# Patient Record
Sex: Male | Born: 1965
Health system: Southern US, Community
[De-identification: ages and names within clinical notes are randomized; demographics above are authoritative.]

## PROBLEM LIST (undated history)

## (undated) ENCOUNTER — Ambulatory Visit (HOSPITAL_COMMUNITY): Admission: EM | Payer: 59 | Source: Home / Self Care

## (undated) DIAGNOSIS — R569 Unspecified convulsions: Secondary | ICD-10-CM

## (undated) DIAGNOSIS — G43909 Migraine, unspecified, not intractable, without status migrainosus: Secondary | ICD-10-CM

## (undated) DIAGNOSIS — Z91030 Bee allergy status: Secondary | ICD-10-CM

## (undated) DIAGNOSIS — I10 Essential (primary) hypertension: Secondary | ICD-10-CM

## (undated) HISTORY — DX: Bee allergy status: Z91.030

## (undated) HISTORY — DX: Unspecified convulsions: R56.9

## (undated) HISTORY — PX: NO PAST SURGERIES: SHX2092

## (undated) HISTORY — DX: Migraine, unspecified, not intractable, without status migrainosus: G43.909

---

## 2007-12-14 ENCOUNTER — Emergency Department (HOSPITAL_COMMUNITY): Admission: EM | Admit: 2007-12-14 | Discharge: 2007-12-14 | Payer: Self-pay | Admitting: Family Medicine

## 2011-07-26 LAB — INFLUENZA A AND B ANTIGEN (CONVERTED LAB)
Inflenza A Ag: NEGATIVE
Influenza B Ag: NEGATIVE

## 2011-07-26 LAB — POCT RAPID STREP A: Streptococcus, Group A Screen (Direct): POSITIVE — AB

## 2014-01-29 ENCOUNTER — Emergency Department (HOSPITAL_COMMUNITY)
Admission: EM | Admit: 2014-01-29 | Discharge: 2014-01-29 | Disposition: A | Discharge status: Home / Self Care | Payer: BC Managed Care – PPO | Attending: Emergency Medicine | Admitting: Emergency Medicine

## 2014-01-29 ENCOUNTER — Encounter (HOSPITAL_COMMUNITY): Payer: Self-pay | Admitting: Emergency Medicine

## 2014-01-29 ENCOUNTER — Emergency Department (HOSPITAL_COMMUNITY): Payer: BC Managed Care – PPO

## 2014-01-29 DIAGNOSIS — N200 Calculus of kidney: Secondary | ICD-10-CM | POA: Insufficient documentation

## 2014-01-29 DIAGNOSIS — I10 Essential (primary) hypertension: Secondary | ICD-10-CM | POA: Insufficient documentation

## 2014-01-29 HISTORY — DX: Essential (primary) hypertension: I10

## 2014-01-29 LAB — URINALYSIS, ROUTINE W REFLEX MICROSCOPIC
Bilirubin Urine: NEGATIVE
Glucose, UA: NEGATIVE mg/dL
Ketones, ur: NEGATIVE mg/dL
Leukocytes, UA: NEGATIVE
Nitrite: NEGATIVE
Protein, ur: NEGATIVE mg/dL
Specific Gravity, Urine: 1.017 (ref 1.005–1.030)
Urobilinogen, UA: 0.2 mg/dL (ref 0.0–1.0)
pH: 7 (ref 5.0–8.0)

## 2014-01-29 LAB — CBC WITH DIFFERENTIAL/PLATELET
Basophils Absolute: 0.1 10*3/uL (ref 0.0–0.1)
Basophils Relative: 1 % (ref 0–1)
Eosinophils Absolute: 0.2 10*3/uL (ref 0.0–0.7)
Eosinophils Relative: 2 % (ref 0–5)
HCT: 40.8 % (ref 39.0–52.0)
Hemoglobin: 14.4 g/dL (ref 13.0–17.0)
Lymphocytes Relative: 24 % (ref 12–46)
Lymphs Abs: 1.9 10*3/uL (ref 0.7–4.0)
MCH: 31.6 pg (ref 26.0–34.0)
MCHC: 35.3 g/dL (ref 30.0–36.0)
MCV: 89.5 fL (ref 78.0–100.0)
Monocytes Absolute: 0.8 10*3/uL (ref 0.1–1.0)
Monocytes Relative: 10 % (ref 3–12)
Neutro Abs: 5 10*3/uL (ref 1.7–7.7)
Neutrophils Relative %: 63 % (ref 43–77)
Platelets: 182 10*3/uL (ref 150–400)
RBC: 4.56 MIL/uL (ref 4.22–5.81)
RDW: 12.1 % (ref 11.5–15.5)
WBC: 8 10*3/uL (ref 4.0–10.5)

## 2014-01-29 LAB — COMPREHENSIVE METABOLIC PANEL
ALT: 42 U/L (ref 0–53)
AST: 38 U/L — ABNORMAL HIGH (ref 0–37)
Albumin: 3.7 g/dL (ref 3.5–5.2)
Alkaline Phosphatase: 72 U/L (ref 39–117)
BUN: 11 mg/dL (ref 6–23)
CO2: 27 mEq/L (ref 19–32)
Calcium: 9.1 mg/dL (ref 8.4–10.5)
Chloride: 103 mEq/L (ref 96–112)
Creatinine, Ser: 1.02 mg/dL (ref 0.50–1.35)
GFR calc Af Amer: >90 mL/min (ref >90)
GFR calc non Af Amer: 86 mL/min — ABNORMAL LOW (ref >90)
Glucose, Bld: 132 mg/dL — ABNORMAL HIGH (ref 70–99)
Potassium: 4 mEq/L (ref 3.7–5.3)
Sodium: 142 mEq/L (ref 137–147)
Total Bilirubin: <0.2 mg/dL — ABNORMAL LOW (ref 0.3–1.2)
Total Protein: 6.9 g/dL (ref 6.0–8.3)

## 2014-01-29 LAB — URINE MICROSCOPIC-ADD ON

## 2014-01-29 MED ORDER — HYDROMORPHONE HCL PF 1 MG/ML IJ SOLN
0.5000 mg | Freq: Once | INTRAMUSCULAR | Status: AC
  Administered 2014-01-29: 0.5 mg via INTRAVENOUS
  Filled 2014-01-29: qty 1

## 2014-01-29 MED ORDER — KETOROLAC TROMETHAMINE 30 MG/ML IJ SOLN
30.0000 mg | Freq: Once | INTRAMUSCULAR | Status: AC
  Administered 2014-01-29: 30 mg via INTRAVENOUS
  Filled 2014-01-29: qty 1

## 2014-01-29 MED ORDER — SODIUM CHLORIDE 0.9 % IV BOLUS (SEPSIS)
1000.0000 mL | Freq: Once | INTRAVENOUS | Status: AC
  Administered 2014-01-29: 1000 mL via INTRAVENOUS

## 2014-01-29 MED ORDER — SODIUM CHLORIDE 0.9 % IV BOLUS (SEPSIS): 1000.0000 mL | Freq: Once | INTRAVENOUS | Status: DC

## 2014-01-29 MED ORDER — HYDROMORPHONE HCL PF 1 MG/ML IJ SOLN
1.0000 mg | Freq: Once | INTRAMUSCULAR | Status: AC
  Administered 2014-01-29: 1 mg via INTRAVENOUS

## 2014-01-29 MED ORDER — HYDROMORPHONE HCL PF 1 MG/ML IJ SOLN
1.0000 mg | Freq: Once | INTRAMUSCULAR | Status: AC
Start: 2014-01-29 — End: 2014-01-29
  Administered 2014-01-29: 1 mg via INTRAVENOUS
  Filled 2014-01-29: qty 1

## 2014-01-29 MED ORDER — ONDANSETRON 4 MG PO TBDP: 4.0000 mg | ORAL_TABLET | Freq: Three times a day (TID) | ORAL | Status: DC | PRN

## 2014-01-29 MED ORDER — TAMSULOSIN HCL 0.4 MG PO CAPS: 0.4000 mg | ORAL_CAPSULE | Freq: Every day | ORAL | Status: DC

## 2014-01-29 MED ORDER — HYDROCODONE-ACETAMINOPHEN 5-325 MG PO TABS: 1.0000 | ORAL_TABLET | Freq: Four times a day (QID) | ORAL | Status: DC | PRN

## 2014-01-29 MED ORDER — HYDROMORPHONE HCL PF 1 MG/ML IJ SOLN
0.5000 mg | Freq: Once | INTRAMUSCULAR | Status: DC
  Filled 2014-01-29: qty 1

## 2014-01-29 NOTE — ED Notes (Signed)
Dr.Lockwood at bedside  

## 2014-01-29 NOTE — Discharge Instructions (Signed)
As discussed, your pain likely reflects the presence/passage of a kidney stone.  Physical medication as directed and be sure to follow up with our urologists.  Return here for any concerning changes in your condition.   Kidney Stones Kidney stones (urolithiasis) are deposits that form inside your kidneys. The intense pain is caused by the stone moving through the urinary tract. When the stone moves, the ureter goes into spasm around the stone. The stone is usually passed in the urine.  CAUSES   A disorder that makes certain neck glands produce too much parathyroid hormone (primary hyperparathyroidism).  A buildup of uric acid crystals, similar to gout in your joints.  Narrowing (stricture) of the ureter.  A kidney obstruction present at birth (congenital obstruction).  Previous surgery on the kidney or ureters.  Numerous kidney infections. SYMPTOMS   Feeling sick to your stomach (nauseous).  Throwing up (vomiting).  Blood in the urine (hematuria).  Pain that usually spreads (radiates) to the groin.  Frequency or urgency of urination. DIAGNOSIS   Taking a history and physical exam.  Blood or urine tests.  CT scan.  Occasionally, an examination of the inside of the urinary bladder (cystoscopy) is performed. TREATMENT   Observation.  Increasing your fluid intake.  Extracorporeal shock wave lithotripsy This is a noninvasive procedure that uses shock waves to break up kidney stones.  Surgery may be needed if you have severe pain or persistent obstruction. There are various surgical procedures. Most of the procedures are performed with the use of small instruments. Only small incisions are needed to accommodate these instruments, so recovery time is minimized. The size, location, and chemical composition are all important variables that will determine the proper choice of action for you. Talk to your health care provider to better understand your situation so that you  will minimize the risk of injury to yourself and your kidney.  HOME CARE INSTRUCTIONS   Drink enough water and fluids to keep your urine clear or pale yellow. This will help you to pass the stone or stone fragments.  Strain all urine through the provided strainer. Keep all particulate matter and stones for your health care provider to see. The stone causing the pain may be as small as a grain of salt. It is very important to use the strainer each and every time you pass your urine. The collection of your stone will allow your health care provider to analyze it and verify that a stone has actually passed. The stone analysis will often identify what you can do to reduce the incidence of recurrences.  Only take over-the-counter or prescription medicines for pain, discomfort, or fever as directed by your health care provider.  Make a follow-up appointment with your health care provider as directed.  Get follow-up X-rays if required. The absence of pain does not always mean that the stone has passed. It may have only stopped moving. If the urine remains completely obstructed, it can cause loss of kidney function or even complete destruction of the kidney. It is your responsibility to make sure X-rays and follow-ups are completed. Ultrasounds of the kidney can show blockages and the status of the kidney. Ultrasounds are not associated with any radiation and can be performed easily in a matter of minutes. SEEK MEDICAL CARE IF:  You experience pain that is progressive and unresponsive to any pain medicine you have been prescribed. SEEK IMMEDIATE MEDICAL CARE IF:   Pain cannot be controlled with the prescribed medicine.  You have  a fever or shaking chills.  The severity or intensity of pain increases over 18 hours and is not relieved by pain medicine.  You develop a new onset of abdominal pain.  You feel faint or pass out.  You are unable to urinate. MAKE SURE YOU:   Understand these  instructions.  Will watch your condition.  Will get help right away if you are not doing well or get worse. Document Released: 10/21/2005 Document Revised: 06/23/2013 Document Reviewed: 03/24/2013 [facility] Patient Information 2014 [place], [place].

## 2014-01-29 NOTE — ED Notes (Signed)
Pt c/o severe progressive right flank pain that has just suddenly worsened within the last half hour, patient states he feels like his stomach is full of air, 2 or 3 days ago patient noted that his urine was a cloudy "close to orange color"

## 2014-01-29 NOTE — ED Provider Notes (Signed)
CSN: [id number]     Arrival date & time 01/29/14  [id number] History   First MD Initiated Contact with Patient 01/29/14 0815     Chief Complaint  Patient presents with  . Flank Pain    right side     HPI  Patient presents with abdominal pain, nausea. Symptoms began approximately one hour ago.  Since onset has been severe is focally about the right flank, with radiation towards the right inguinal crease. There is associated nausea, generalized abdominal discomfort as well. No symptoms at relief thus far. Patient was generally well until this, though he notes that over the past few days he has had discolored urine. No dysuria, no penile or scrotal pain. No fever, no chills, no chest pain, no dyspnea. Patient has no history of abdominal surgery or any stone. He does have a history of hypertension for which he is not currently taking his medication.   Past Medical History  Diagnosis Date  . HTN (hypertension)    History reviewed. No pertinent past surgical history. No family history on file. History  Substance Use Topics  . Smoking status: Not on file  . Smokeless tobacco: Not on file  . Alcohol Use: Not on file    Review of Systems  Constitutional:       Per HPI, otherwise negative  HENT:       Per HPI, otherwise negative  Respiratory:       Per HPI, otherwise negative  Cardiovascular:       Per HPI, otherwise negative  Gastrointestinal: Positive for nausea and abdominal pain. Negative for vomiting.  Endocrine:       Negative aside from HPI  Genitourinary:       Darker urine recently  Musculoskeletal:       Per HPI, otherwise negative  Skin: Negative.   Neurological: Negative for syncope.      Allergies  Bee venom  Home Medications  No current outpatient prescriptions on file. BP 182/109  Pulse 63  Temp(Src) 98.7 F (37.1 C) (Axillary)  Resp 22  Ht 5\' 7"  (1.702 m)  Wt 140 lb (63.504 kg)  BMI 21.92 kg/m2  SpO2 100% Physical Exam  Nursing note and vitals  reviewed. Constitutional: He is oriented to person, place, and time. He appears well-developed. No distress.  HENT:  Head: Normocephalic and atraumatic.  Eyes: Conjunctivae and EOM are normal.  Cardiovascular: Normal rate and regular rhythm.   Pulmonary/Chest: Effort normal. No stridor. No respiratory distress.  Abdominal: Soft. Normal appearance. He exhibits no distension. There is tenderness in the right lower quadrant. There is CVA tenderness.    Musculoskeletal: He exhibits no edema.  Neurological: He is alert and oriented to person, place, and time.  Skin: Skin is warm and dry.  Psychiatric: He has a normal mood and affect.    ED Course  Procedures (including critical care time) Labs Review Labs Reviewed  COMPREHENSIVE METABOLIC PANEL - Abnormal; Notable for the following:    Glucose, Bld 132 (*)    AST 38 (*)    Total Bilirubin <0.2 (*)    GFR calc non Af Amer 86 (*)    All other components within normal limits  URINALYSIS, ROUTINE W REFLEX MICROSCOPIC - Abnormal; Notable for the following:    Hgb urine dipstick SMALL (*)    All other components within normal limits  CBC WITH DIFFERENTIAL  URINE MICROSCOPIC-ADD ON   Imaging Review No results found.  11:08 AM Pain has resolved  1:30 PM  Patient and wife aware of all results.  Patient sitting up, and in no distress. Ultrasound is not clearly demonstrated a stone, and there is mild hydronephrosis, but no perinephric stranding or overt signs of infection. Patient has no other signs of infection. Patient continues to make urine.   MDM   Final diagnoses:  Kidney stone    Patient presents with acute onset of right flank pain.  On exam he is awake, alert, afebrile, hemodynamically stable, but mild hypertension.  The patient has not taken today's blood pressure medication. There is no evidence of endorgan failure. Patient's presentation is most consistent with kidney stone.  After a lengthy conversation about all  findings, the patient deferred CT scan for further elucidation of possible stone given the remainder of his clinical scenario is most consistent with stone.  This seems reasonable.  Patient will follow up with urology.    Gerhard Munch, MD 01/29/14 1332

## 2014-01-29 NOTE — ED Notes (Signed)
MD at bedside. 

## 2014-03-01 ENCOUNTER — Ambulatory Visit (INDEPENDENT_AMBULATORY_CARE_PROVIDER_SITE_OTHER): Payer: BC Managed Care – PPO | Admitting: General Surgery

## 2014-03-01 ENCOUNTER — Encounter (INDEPENDENT_AMBULATORY_CARE_PROVIDER_SITE_OTHER): Payer: Self-pay | Admitting: General Surgery

## 2014-03-01 ENCOUNTER — Ambulatory Visit (INDEPENDENT_AMBULATORY_CARE_PROVIDER_SITE_OTHER): Payer: Self-pay | Admitting: General Surgery

## 2014-03-01 VITALS — BP 140/80 | HR 74 | Temp 96.8°F | Resp 14 | Ht 66.0 in | Wt 143.8 lb

## 2014-03-01 DIAGNOSIS — K4090 Unilateral inguinal hernia, without obstruction or gangrene, not specified as recurrent: Secondary | ICD-10-CM

## 2014-03-01 NOTE — Patient Instructions (Signed)
You have a small right inguinal hernia that is reducible.  There is no emergency, but over time  this will enlarge and become painful  I do not think that the hernia caused your right flank pain and transient right hydronephrosis.. I recommended that you discuss this further with Dr. Margarita [name].  Discuss this with your wife, and call me back when you decide to have the surgery. Repair of your hernia would be an outpatient surgery, and you would go home the same day.     Inguinal Hernia, Adult Muscles help keep everything in the body in its proper place. But if a weak spot in the muscles develops, something can poke through. That is called a hernia. When this happens in the lower part of the belly (abdomen), it is called an inguinal hernia. (It takes its name from a part of the body in this region called the inguinal canal.) A weak spot in the wall of muscles lets some fat or part of the small intestine bulge through. An inguinal hernia can develop at any age. Men get them more often than women. CAUSES  In adults, an inguinal hernia develops over time.  It can be triggered by:  Suddenly straining the muscles of the lower abdomen.  Lifting heavy objects.  Straining to have a bowel movement. Difficult bowel movements (constipation) can lead to this.  Constant coughing. This may be caused by smoking or lung disease.  Being overweight.  Being pregnant.  Working at a job that requires long periods of standing or heavy lifting.  Having had an inguinal hernia before. One type can be an emergency situation. It is called a strangulated inguinal hernia. It develops if part of the small intestine slips through the weak spot and cannot get back into the abdomen. The blood supply can be cut off. If that happens, part of the intestine may die. This situation requires emergency surgery. SYMPTOMS  Often, a small inguinal hernia has no symptoms. It is found when a healthcare provider does a physical  exam. Larger hernias usually have symptoms.   In adults, symptoms may include:  A lump in the groin. This is easier to see when the person is standing. It might disappear when lying down.  In men, a lump in the scrotum.  Pain or burning in the groin. This occurs especially when lifting, straining or coughing.  A dull ache or feeling of pressure in the groin.  Signs of a strangulated hernia can include:  A bulge in the groin that becomes very painful and tender to the touch.  A bulge that turns red or purple.  Fever, nausea and vomiting.  Inability to have a bowel movement or to pass gas. DIAGNOSIS  To decide if you have an inguinal hernia, a healthcare provider will probably do a physical examination.  This will include asking questions about any symptoms you have noticed.  The healthcare provider might feel the groin area and ask you to cough. If an inguinal hernia is felt, the healthcare provider may try to slide it back into the abdomen.  Usually no other tests are needed. TREATMENT  Treatments can vary. The size of the hernia makes a difference. Options include:  Watchful waiting. This is often suggested if the hernia is small and you have had no symptoms.  No medical procedure will be done unless symptoms develop.  You will need to watch closely for symptoms. If any occur, contact your healthcare provider right away.  Surgery. This is  used if the hernia is larger or you have symptoms.  Open surgery. This is usually an outpatient procedure (you will not stay overnight in a hospital). An cut (incision) is made through the skin in the groin. The hernia is put back inside the abdomen. The weak area in the muscles is then repaired by herniorrhaphy or hernioplasty. Herniorrhaphy: in this type of surgery, the weak muscles are sewn back together. Hernioplasty: a patch or mesh is used to close the weak area in the abdominal wall.  Laparoscopy. In this procedure, a surgeon makes  small incisions. A thin tube with a tiny video camera (called a laparoscope) is put into the abdomen. The surgeon repairs the hernia with mesh by looking with the video camera and using two long instruments. HOME CARE INSTRUCTIONS   After surgery to repair an inguinal hernia:  You will need to take pain medicine prescribed by your healthcare provider. Follow all directions carefully.  You will need to take care of the wound from the incision.  Your activity will be restricted for awhile. This will probably include no heavy lifting for several weeks. You also should not do anything too active for a few weeks. When you can return to work will depend on the type of job that you have.  During "watchful waiting" periods, you should:  Maintain a healthy weight.  Eat a diet high in fiber (fruits, vegetables and whole grains).  Drink plenty of fluids to avoid constipation. This means drinking enough water and other liquids to keep your urine clear or pale yellow.  Do not lift heavy objects.  Do not stand for long periods of time.  Quit smoking. This should keep you from developing a frequent cough. SEEK MEDICAL CARE IF:   A bulge develops in your groin area.  You feel pain, a burning sensation or pressure in the groin. This might be worse if you are lifting or straining.  You develop a fever of more than 100.5 F (38.1 C). SEEK IMMEDIATE MEDICAL CARE IF:   Pain in the groin increases suddenly.  A bulge in the groin gets bigger suddenly and does not go down.  For men, there is sudden pain in the scrotum. Or, the size of the scrotum increases.  A bulge in the groin area becomes red or purple and is painful to touch.  You have nausea or vomiting that does not go away.  You feel your heart beating much faster than normal.  You cannot have a bowel movement or pass gas.  You develop a fever of more than 102.0 F (38.9 C). Document Released: 03/09/2009 Document Revised: 01/13/2012  Document Reviewed: 03/09/2009 [facility] Patient Information 2014 [place].

## 2014-03-01 NOTE — Progress Notes (Signed)
Patient ID: Jonathan SearingJohn P Corsi, male   DOB: 1966/05/26, 48 y.o.   MRN: [id number]  Chief Complaint  Patient presents with  . Incisional Hernia     Note: This dictation was prepared with Dragon/digital dictation along with Smartphrase technology. Any transcriptional errors that result from this process are unintentional.   HPI Jonathan [name] is a 48 y.o. male.  He is referred by Dr. Margarita GrizzleWoodruff of [facility] for evaluation of a right inguinal hernia.  The patient has no prior history of hernia. On March 28 he presented to the emergency department with severe right flank pain. He had right lower quadrant pain and right testicular pain but it was a much lesser intent intensity. Kidney stone was suspected. Ultrasound showed right hydronephrosis but no stone. CT was not done. His pain resolved within 2 hours. He [facility] April [facility]. CT stone protocol was performed which showed that the hydronephrosis had resolved, no stones were seen, and a small right inguinal hernia containing fat was noted. He feels fine today.  Past history reveals that he is healthy. Has hypertension. No other major medical problems. He is married and has an 7011 month-old baby at home.  HPI  Past Medical History  Diagnosis Date  . HTN (hypertension)     History reviewed. No pertinent past surgical history.  Family History  Problem Relation Age of Onset  . Cancer Maternal Grandmother     breast    Social History History  Substance Use Topics  . Smoking status: Never Smoker   . Smokeless tobacco: Not on file  . Alcohol Use: Yes    Allergies  Allergen Reactions  . Bee Venom     When he was child    Current Outpatient Prescriptions  Medication Sig Dispense Refill  . lisinopril-hydrochlorothiazide (PRINZIDE,ZESTORETIC) 20-12.5 MG per tablet Take 1 tablet by mouth daily.       No current facility-administered medications for this visit.    Review of  Systems Review of Systems  Constitutional: Negative for fever, chills and unexpected weight change.  HENT: Negative for congestion, hearing loss, sore throat, trouble swallowing and voice change.   Eyes: Negative for visual disturbance.  Respiratory: Negative for cough and wheezing.   Cardiovascular: Negative for chest pain, palpitations and leg swelling.  Gastrointestinal: Positive for abdominal pain. Negative for nausea, vomiting, diarrhea, constipation, blood in stool, abdominal distention, anal bleeding and rectal pain.  Genitourinary: Positive for flank pain and testicular pain. Negative for hematuria and difficulty urinating.  Musculoskeletal: Negative for arthralgias.  Skin: Negative for rash and wound.  Neurological: Negative for seizures, syncope, weakness and headaches.  Hematological: Negative for adenopathy. Does not bruise/bleed easily.  Psychiatric/Behavioral: Negative for confusion.    Blood pressure 140/80, pulse 74, temperature 96.8 F (36 C), resp. rate 14, height 5\' 6"  (1.676 m), weight 143 lb 12.8 oz (65.227 kg).  Physical Exam Physical Exam  Constitutional: He is oriented to person, place, and time. He appears well-developed and well-nourished. No distress.  thin. Appears fit.  HENT:  Head: Normocephalic.  Nose: Nose normal.  Mouth/Throat: No oropharyngeal exudate.  Eyes: Conjunctivae and EOM are normal. Pupils are equal, round, and reactive to light. Right eye exhibits no discharge. Left eye exhibits no discharge. No scleral icterus.  Neck: Normal range of motion. Neck supple. No JVD present. No tracheal deviation present. No thyromegaly present.  Cardiovascular: Normal rate, regular rhythm, normal heart sounds and intact distal pulses.  No murmur heard. Pulmonary/Chest: Effort normal and breath sounds normal. No stridor. No respiratory distress. He has no wheezes. He has no rales. He exhibits no tenderness.  Abdominal: Soft. Bowel sounds are normal. He exhibits  no distension and no mass. There is no tenderness. There is no rebound and no guarding.  Soft and nontender. Umbilicus normal. No flank tenderness.  Genitourinary:  Small but definite reducible right inguinal hernia. No hernia on left. Penis scrotum and testes normal.  Musculoskeletal: Normal range of motion. He exhibits no edema and no tenderness.  Lymphadenopathy:    He has no cervical adenopathy.  Neurological: He is alert and oriented to person, place, and time. He has normal reflexes. Coordination normal.  Skin: Skin is warm and dry. No rash noted. He is not diaphoretic. No erythema. No pallor.  Psychiatric: He has a normal mood and affect. His behavior is normal. Judgment and thought content normal.    Data Reviewed ED records. Ultrasound. Urology records. CT stone protocol.  Assessment    Right inguinal hernia, minimally symptomatic  History of severe right flank pain radiating to the right testicle. I do not think that this pain was due to his hernia. Given the transient hydronephrosis, urologic etiology is suspected.     Plan    I recommended that he followup with Dr. Margarita GrizzleWoodruff electively to see if anything further is indicated from a urologic standpoint  In terms of his right inguinal hernia. I discussed the natural history of this. He is aware that of the potential for enlargement and pain. He is aware that surgical repair  is appropriate but elective. He is thinking that he may want to do this sooner rather than later.  I discussed the indications, details, techniques, and numerous risks of the surgery with him. I discussed open techniques and laparoscopic techniques. He did not seem strongly in favor one over the other. He is aware of the risk of bleeding, infection, recurrence, nerve damage with chronic pain, injury to adjacent organs as the testicle, bladder intestine with major restruction. All his questions were answered. He understands all these issues.  He is going to  go home and discuss this with his wife. He will call me back when he makes a decision about repair of right inguinal hernia.        Angelia MouldHaywood M. Derrell LollingIngram, M.D., [facility] General and Minimally invasive Surgery Breast and Colorectal Surgery Office:   [phone] Pager:   [phone]  03/01/2014, 11:53 AM

## 2014-03-10 ENCOUNTER — Encounter (INDEPENDENT_AMBULATORY_CARE_PROVIDER_SITE_OTHER): Payer: Self-pay

## 2014-06-20 ENCOUNTER — Emergency Department (HOSPITAL_COMMUNITY)
Admission: EM | Admit: 2014-06-20 | Discharge: 2014-06-20 | Disposition: A | Discharge status: Home / Self Care | Payer: BC Managed Care – PPO | Attending: Emergency Medicine | Admitting: Emergency Medicine

## 2014-06-20 ENCOUNTER — Emergency Department (HOSPITAL_COMMUNITY): Payer: BC Managed Care – PPO

## 2014-06-20 ENCOUNTER — Encounter (HOSPITAL_COMMUNITY): Payer: Self-pay | Admitting: Emergency Medicine

## 2014-06-20 DIAGNOSIS — W268XXA Contact with other sharp object(s), not elsewhere classified, initial encounter: Secondary | ICD-10-CM | POA: Insufficient documentation

## 2014-06-20 DIAGNOSIS — Y9289 Other specified places as the place of occurrence of the external cause: Secondary | ICD-10-CM | POA: Insufficient documentation

## 2014-06-20 DIAGNOSIS — Z79899 Other long term (current) drug therapy: Secondary | ICD-10-CM | POA: Insufficient documentation

## 2014-06-20 DIAGNOSIS — W208XXA Other cause of strike by thrown, projected or falling object, initial encounter: Secondary | ICD-10-CM | POA: Insufficient documentation

## 2014-06-20 DIAGNOSIS — I10 Essential (primary) hypertension: Secondary | ICD-10-CM | POA: Insufficient documentation

## 2014-06-20 DIAGNOSIS — S61509A Unspecified open wound of unspecified wrist, initial encounter: Secondary | ICD-10-CM | POA: Diagnosis present

## 2014-06-20 DIAGNOSIS — IMO0002 Reserved for concepts with insufficient information to code with codable children: Secondary | ICD-10-CM

## 2014-06-20 DIAGNOSIS — Y9389 Activity, other specified: Secondary | ICD-10-CM | POA: Insufficient documentation

## 2014-06-20 DIAGNOSIS — T148XXA Other injury of unspecified body region, initial encounter: Secondary | ICD-10-CM

## 2014-06-20 MED ORDER — LIDOCAINE HCL 2 % IJ SOLN
10.0000 mL | Freq: Once | INTRAMUSCULAR | Status: AC
  Administered 2014-06-20: 200 mg via INTRADERMAL

## 2014-06-20 MED ORDER — ACETAMINOPHEN 500 MG PO TABS
1000.0000 mg | ORAL_TABLET | Freq: Once | ORAL | Status: AC
  Administered 2014-06-20: 1000 mg via ORAL
  Filled 2014-06-20: qty 2

## 2014-06-20 NOTE — ED Notes (Addendum)
Pt comes in with laceration to left wrist when moving a cabinet and not realizing glass was on top of it falling off and cutting pt's left wrist.  Pt has about 1 inch laceration. Put Normal Saline soaked gauze over it and wrapped with Kerlex.

## 2014-06-20 NOTE — ED Provider Notes (Signed)
CSN: [id number]     Arrival date & time 06/20/14  1610 History   First MD Initiated Contact with Patient 06/20/14 1837     Chief Complaint  Patient presents with  . Laceration    left wrist     (Consider location/radiation/quality/duration/timing/severity/associated sxs/prior Treatment) HPI  Jonathan [name] is a 48 y.o. male complaining of laceration to left wrist after a piece of glass fell onto it earlier in the evening and shattered. Patient denies any pulsatile bleeding. States his last shot was within last 3 years. Pain is minimal and bleeding is controlled. He denies any weakness, numbness, paresthesia, reduced range of motion. Patient is right-hand dominant   Past Medical History  Diagnosis Date  . HTN (hypertension)    History reviewed. No pertinent past surgical history. Family History  Problem Relation Age of Onset  . Cancer Maternal Grandmother     breast   History  Substance Use Topics  . Smoking status: Never Smoker   . Smokeless tobacco: Not on file  . Alcohol Use: Yes    Review of Systems  10 systems reviewed and found to be negative, except as noted in the HPI.   Allergies  Bee venom  Home Medications   Prior to Admission medications   Medication Sig Start Date End Date Taking? Authorizing Provider  lisinopril-hydrochlorothiazide (PRINZIDE,ZESTORETIC) 20-12.5 MG per tablet Take 1 tablet by mouth daily.   Yes Historical Provider, MD   BP 194/90  Pulse 74  Temp(Src) 97.9 F (36.6 C) (Oral)  Resp 17  SpO2 99% Physical Exam  Nursing note and vitals reviewed. Constitutional: He is oriented to person, place, and time. He appears well-developed and well-nourished. No distress.  HENT:  Head: Normocephalic and atraumatic.  Mouth/Throat: Oropharynx is clear and moist.  Eyes: Conjunctivae and EOM are normal. Pupils are equal, round, and reactive to light.  Neck: Normal range of motion.  Cardiovascular: Normal rate, regular rhythm and intact distal  pulses.   Pulmonary/Chest: Effort normal and breath sounds normal. No stridor. No respiratory distress. He has no wheezes. He has no rales. He exhibits no tenderness.  Abdominal: Soft. Bowel sounds are normal. He exhibits no distension and no mass. There is no tenderness. There is no rebound and no guarding.  Musculoskeletal: Normal range of motion.       Arms: Neurological: He is alert and oriented to person, place, and time.  Psychiatric: He has a normal mood and affect.    ED Course  LACERATION REPAIR Date/Time: 06/21/2014 2:17 AM Performed by: Wynetta EmeryPISCIOTTA, Eleanor Gatliff Authorized by: Wynetta EmeryPISCIOTTA, Gavino Fouch Consent: Verbal consent obtained. Consent given by: patient Patient identity confirmed: verbally with patient Body area: upper extremity Location details: left wrist Laceration length: 3 cm Foreign bodies: no foreign bodies Tendon involvement: none Nerve involvement: none Vascular damage: no Anesthesia: local infiltration Local anesthetic: lidocaine 2% without epinephrine Anesthetic total: 2 ml Patient sedated: no Preparation: Patient was prepped and draped in the usual sterile fashion. Irrigation solution: saline Irrigation method: jet lavage Debridement: none Degree of undermining: none Skin closure: 4-0 nylon Number of sutures: 4 Technique: running Approximation: close Approximation difficulty: simple Patient tolerance: Patient tolerated the procedure well with no immediate complications.   (including critical care time) Labs Review Labs Reviewed - No data to display  Imaging Review Dg Wrist Complete Left  06/20/2014   CLINICAL DATA:  Laceration to radial side of LEFT wrist  EXAM: LEFT WRIST - COMPLETE 3+ VIEW  COMPARISON:  None  FINDINGS: Dressing artifacts at  radial aspect of LEFT wrist.  Osseous mineralization normal.  Joint spaces preserved.  No acute fracture, dislocation, or bone destruction.  Soft tissues otherwise unremarkable without gross radiopaque foreign body  identified.  IMPRESSION: No acute osseous abnormalities.   Electronically Signed   By: Ulyses Southward M.D.   On: 06/20/2014 17:28     EKG Interpretation None      MDM   Final diagnoses:  Laceration  Skin avulsion   Filed Vitals:   06/20/14 1624  BP: 194/90  Pulse: 74  Temp: 97.9 F (36.6 C)  TempSrc: Oral  Resp: 17  SpO2: 99%    Medications  acetaminophen (TYLENOL) tablet 1,000 mg (1,000 mg Oral Given 06/20/14 1932)  lidocaine (XYLOCAINE) 2 % (with pres) injection 200 mg (200 mg Intradermal Given by Other 06/20/14 1933)    Jonathan [name] is a 48 y.o. male presenting with laceration and avulsion to left wrist.  No vascular, tendon or nerve involvement. The laceration is closed and wound care is discussed for the avulsion`   Evaluation does not show pathology that would require ongoing emergent intervention or inpatient treatment. Pt is hemodynamically stable and mentating appropriately. Discussed findings and plan with patient/guardian, who agrees with care plan. All questions answered. Return precautions discussed and outpatient follow up given.     Wynetta Emery, PA-C 06/21/14 [phone]

## 2014-06-20 NOTE — Discharge Instructions (Signed)
Keep wound dry and do not remove dressing for 24 hours if possible. After that, wash gently morning and night (every 12 hours) with soap and water. Use a topical antibiotic ointment and cover with a bandaid or gauze.    Do NOT use rubbing alcohol or hydrogen peroxide, do not soak the area   Present to your primary care doctor or the urgent care of your choice, or the ED for suture removal in 7-10 days.   Every attempt was made to remove foreign body (contaminants) from the wound.  However, there is always a chance that some may remain in the wound. This can  increase your risk of infection.   If you see signs of infection (warmth, redness, tenderness, pus, sharp increase in pain, fever, red streaking in the skin) immediately return to the emergency department.   After the wound heals fully, apply sunscreen for 6-12 months to minimize scarring.   Please follow with your primary care doctor in the next 2 days for a check-up. They must obtain records for further management.   Do not hesitate to return to the Emergency Department for any new, worsening or concerning symptoms.   Laceration Care, Adult A laceration is a cut or lesion that goes through all layers of the skin and into the tissue just beneath the skin. TREATMENT  Some lacerations may not require closure. Some lacerations may not be able to be closed due to an increased risk of infection. It is important to see your caregiver as soon as possible after an injury to minimize the risk of infection and maximize the opportunity for successful closure. If closure is appropriate, pain medicines may be given, if needed. The wound will be cleaned to help prevent infection. Your caregiver will use stitches (sutures), staples, wound glue (adhesive), or skin adhesive strips to repair the laceration. These tools bring the skin edges together to allow for faster healing and a better cosmetic outcome. However, all wounds will heal with a scar. Once the  wound has healed, scarring can be minimized by covering the wound with sunscreen during the day for 1 full year. HOME CARE INSTRUCTIONS  For sutures or staples:  Keep the wound clean and dry.  If you were given a bandage (dressing), you should change it at least once a day. Also, change the dressing if it becomes wet or dirty, or as directed by your caregiver.  Wash the wound with soap and water 2 times a day. Rinse the wound off with water to remove all soap. Pat the wound dry with a clean towel.  After cleaning, apply a thin layer of the antibiotic ointment as recommended by your caregiver. This will help prevent infection and keep the dressing from sticking.  You may shower as usual after the first 24 hours. Do not soak the wound in water until the sutures are removed.  Only take over-the-counter or prescription medicines for pain, discomfort, or fever as directed by your caregiver.  Get your sutures or staples removed as directed by your caregiver. For skin adhesive strips:  Keep the wound clean and dry.  Do not get the skin adhesive strips wet. You may bathe carefully, using caution to keep the wound dry.  If the wound gets wet, pat it dry with a clean towel.  Skin adhesive strips will fall off on their own. You may trim the strips as the wound heals. Do not remove skin adhesive strips that are still stuck to the wound. They will  fall off in time. For wound adhesive:  You may briefly wet your wound in the shower or bath. Do not soak or scrub the wound. Do not swim. Avoid periods of heavy perspiration until the skin adhesive has fallen off on its own. After showering or bathing, gently pat the wound dry with a clean towel.  Do not apply liquid medicine, cream medicine, or ointment medicine to your wound while the skin adhesive is in place. This may loosen the film before your wound is healed.  If a dressing is placed over the wound, be careful not to apply tape directly over the  skin adhesive. This may cause the adhesive to be pulled off before the wound is healed.  Avoid prolonged exposure to sunlight or tanning lamps while the skin adhesive is in place. Exposure to ultraviolet light in the first year will darken the scar.  The skin adhesive will usually remain in place for 5 to 10 days, then naturally fall off the skin. Do not pick at the adhesive film. You may need a tetanus shot if:  You cannot remember when you had your last tetanus shot.  You have never had a tetanus shot. If you get a tetanus shot, your arm may swell, get red, and feel warm to the touch. This is common and not a problem. If you need a tetanus shot and you choose not to have one, there is a rare chance of getting tetanus. Sickness from tetanus can be serious. SEEK MEDICAL CARE IF:   You have redness, swelling, or increasing pain in the wound.  You see a red line that goes away from the wound.  You have yellowish-white fluid (pus) coming from the wound.  You have a fever.  You notice a bad smell coming from the wound or dressing.  Your wound breaks open before or after sutures have been removed.  You notice something coming out of the wound such as wood or glass.  Your wound is on your hand or foot and you cannot move a finger or toe. SEEK IMMEDIATE MEDICAL CARE IF:   Your pain is not controlled with prescribed medicine.  You have severe swelling around the wound causing pain and numbness or a change in color in your arm, hand, leg, or foot.  Your wound splits open and starts bleeding.  You have worsening numbness, weakness, or loss of function of any joint around or beyond the wound.  You develop painful lumps near the wound or on the skin anywhere on your body. MAKE SURE YOU:   Understand these instructions.  Will watch your condition.  Will get help right away if you are not doing well or get worse. Document Released: 10/21/2005 Document Revised: 01/13/2012 Document  Reviewed: 04/16/2011 [facility] Patient Information 2015 [place]ExitCare, [place]. This information is not intended to replace advice given to you by your health care provider. Make sure you discuss any questions you have with your health care provider.

## 2014-06-22 NOTE — ED Provider Notes (Signed)
Medical screening examination/treatment/procedure(s) were performed by non-physician practitioner and as supervising physician I was immediately available for consultation/collaboration.   EKG Interpretation None       Raeford RazorStephen Brittainy Bucker, MD 06/22/14 1416

## 2014-06-24 ENCOUNTER — Ambulatory Visit (INDEPENDENT_AMBULATORY_CARE_PROVIDER_SITE_OTHER): Payer: BC Managed Care – PPO | Admitting: General Surgery

## 2014-06-24 ENCOUNTER — Encounter (INDEPENDENT_AMBULATORY_CARE_PROVIDER_SITE_OTHER): Payer: Self-pay | Admitting: General Surgery

## 2014-06-24 DIAGNOSIS — K4090 Unilateral inguinal hernia, without obstruction or gangrene, not specified as recurrent: Secondary | ICD-10-CM | POA: Insufficient documentation

## 2014-06-24 NOTE — Patient Instructions (Signed)
You have a small right inguinal hernia. This is minimally symptomatic.  Your options at this point in time are to wait until it bothers you more, or to go ahead with surgery electively.   We have decided to go ahead with the repair and we decided to go ahead with an open repair  You will be scheduled for open repair of a rightinguinal hernia with mesh at your convenience. This is an outpatient surgery.    Inguinal Hernia, Adult Muscles help keep everything in the body in its proper place. But if a weak spot in the muscles develops, something can poke through. That is called a hernia. When this happens in the lower part of the belly (abdomen), it is called an inguinal hernia. (It takes its name from a part of the body in this region called the inguinal canal.) A weak spot in the wall of muscles lets some fat or part of the small intestine bulge through. An inguinal hernia can develop at any age. Men get them more often than women. CAUSES  In adults, an inguinal hernia develops over time.  It can be triggered by:  Suddenly straining the muscles of the lower abdomen.  Lifting heavy objects.  Straining to have a bowel movement. Difficult bowel movements (constipation) can lead to this.  Constant coughing. This may be caused by smoking or lung disease.  Being overweight.  Being pregnant.  Working at a job that requires long periods of standing or heavy lifting.  Having had an inguinal hernia before. One type can be an emergency situation. It is called a strangulated inguinal hernia. It develops if part of the small intestine slips through the weak spot and cannot get back into the abdomen. The blood supply can be cut off. If that happens, part of the intestine may die. This situation requires emergency surgery. SYMPTOMS  Often, a small inguinal hernia has no symptoms. It is found when a healthcare provider does a physical exam. Larger hernias usually have symptoms.   In adults,  symptoms may include:  A lump in the groin. This is easier to see when the person is standing. It might disappear when lying down.  In men, a lump in the scrotum.  Pain or burning in the groin. This occurs especially when lifting, straining or coughing.  A dull ache or feeling of pressure in the groin.  Signs of a strangulated hernia can include:  A bulge in the groin that becomes very painful and tender to the touch.  A bulge that turns red or purple.  Fever, nausea and vomiting.  Inability to have a bowel movement or to pass gas. DIAGNOSIS  To decide if you have an inguinal hernia, a healthcare provider will probably do a physical examination.  This will include asking questions about any symptoms you have noticed.  The healthcare provider might feel the groin area and ask you to cough. If an inguinal hernia is felt, the healthcare provider may try to slide it back into the abdomen.  Usually no other tests are needed. TREATMENT  Treatments can vary. The size of the hernia makes a difference. Options include:  Watchful waiting. This is often suggested if the hernia is small and you have had no symptoms.  No medical procedure will be done unless symptoms develop.  You will need to watch closely for symptoms. If any occur, contact your healthcare provider right away.  Surgery. This is used if the hernia is larger or you have symptoms.  Open surgery. This is usually an outpatient procedure (you will not stay overnight in a hospital). An cut (incision) is made through the skin in the groin. The hernia is put back inside the abdomen. The weak area in the muscles is then repaired by herniorrhaphy or hernioplasty. Herniorrhaphy: in this type of surgery, the weak muscles are sewn back together. Hernioplasty: a patch or mesh is used to close the weak area in the abdominal wall.  Laparoscopy. In this procedure, a surgeon makes small incisions. A thin tube with a tiny video camera  (called a laparoscope) is put into the abdomen. The surgeon repairs the hernia with mesh by looking with the video camera and using two long instruments. HOME CARE INSTRUCTIONS   After surgery to repair an inguinal hernia:  You will need to take pain medicine prescribed by your healthcare provider. Follow all directions carefully.  You will need to take care of the wound from the incision.  Your activity will be restricted for awhile. This will probably include no heavy lifting for several weeks. You also should not do anything too active for a few weeks. When you can return to work will depend on the type of job that you have.  During "watchful waiting" periods, you should:  Maintain a healthy weight.  Eat a diet high in fiber (fruits, vegetables and whole grains).  Drink plenty of fluids to avoid constipation. This means drinking enough water and other liquids to keep your urine clear or pale yellow.  Do not lift heavy objects.  Do not stand for long periods of time.  Quit smoking. This should keep you from developing a frequent cough. SEEK MEDICAL CARE IF:   A bulge develops in your groin area.  You feel pain, a burning sensation or pressure in the groin. This might be worse if you are lifting or straining.  You develop a fever of more than 100.5 F (38.1 C). SEEK IMMEDIATE MEDICAL CARE IF:   Pain in the groin increases suddenly.  A bulge in the groin gets bigger suddenly and does not go down.  For men, there is sudden pain in the scrotum. Or, the size of the scrotum increases.  A bulge in the groin area becomes red or purple and is painful to touch.  You have nausea or vomiting that does not go away.  You feel your heart beating much faster than normal.  You cannot have a bowel movement or pass gas.  You develop a fever of more than 102.0 F (38.9 C). Document Released: 03/09/2009 Document Revised: 01/13/2012 Document Reviewed: 03/09/2009 [facility]Exit[facility] Patient  Information 2015 [place], [place]. This information is not intended to replace advice given to you by your health care provider. Make sure you discuss any questions you have with your health care provider.

## 2014-06-24 NOTE — Progress Notes (Signed)
Patient ID: Jonathan SearingJohn P [name], male   DOB: 05/29/1966, 48 y.o.   MRN: [id number] History: This gentleman returns to have further discussion and planning of his right inguinal hernia repair. Initial presentation is summarized below: He is referred by Dr. Margarita GrizzleWoodruff of [facility] for evaluation of a right inguinal hernia.  The patient has no prior history of hernia. On March 28 he presented to the emergency department with severe right flank pain. He had right lower quadrant pain and right testicular pain but it was a much lesser intent intensity. Kidney stone was suspected. Ultrasound showed right hydronephrosis but no stone. CT was not done. His pain resolved within 2 hours. He followed up [facility] April [facility]. CT stone protocol was performed which showed that the hydronephrosis had resolved, no stones were seen, and a small right inguinal hernia containing fat was noted. He feels fine today.  Past history reveals that he is healthy. Has hypertension. No other major medical problems. He is married and has an 1311 month-old baby at home.   Past history, family history, social history, and review of systems are documented on the chart, unchanged, and noncontributory except as described above.   Physical Exam  Constitutional: He is oriented to person, place, and time. He appears well-developed and well-nourished. No distress.  thin. Appears fit.   Eyes: Conjunctivae and EOM are normal. Pupils are equal, round, and reactive to light. Right eye exhibits no discharge. Left eye exhibits no discharge. No scleral icterus.  Neck: Normal range of motion. Neck supple. No JVD present. No tracheal deviation present. No thyromegaly present.  Cardiovascular: Normal rate, regular rhythm, normal heart sounds and intact distal pulses.  No murmur heard.  Pulmonary/Chest: Effort normal and breath sounds normal. No stridor. No respiratory distress. He has no wheezes. He has no rales.  He exhibits no tenderness.  Abdominal: Soft. Bowel sounds are normal. He exhibits no distension and no mass. There is no tenderness. There is no rebound and no guarding.  Soft and nontender. Umbilicus normal. No flank tenderness.  Genitourinary:  Small but definite reducible right inguinal hernia. No hernia on left. Penis scrotum and testes normal.  Musculoskeletal: Normal range of motion. He exhibits no edema and no tenderness.   Neurological: He is alert and oriented to person, place, and time. He has normal reflexes. Coordination normal.  Skin: Skin is warm and dry. No rash noted. He is not diaphoretic. No erythema. No pallor.  Psychiatric: He has a normal mood and affect. His behavior is normal. Judgment and thought content normal.   Assessment: Right inguinal hernia, small but definite. Minimally symptomatic. Patient desires repair at this time Recent evaluation for right flank pain, right testicular pain and right hydronephrosis. This is resolved following extensive urologic workup  Plan: The patient was told that repair of his small right inguinal hernia is elective. He understood the natural history and the brother have this repaired before it gets bigger. I told him that was reasonable. We talked about temporary disability and returned to work issues. Once again I talked about the techniques and risks and whenever everything that we talked about last time. He understands all these issues and all his questions are answered. He wants to go ahead with the surgery. This will be scheduled as an outpatient in the near future.   Angelia MouldHaywood M. Derrell LollingIngram, M.D., [facility] CenterFACS [facility] General and Minimally invasive Surgery Breast and Colorectal Surgery Office:   [phone]  Pager:   [phone]

## 2014-12-23 ENCOUNTER — Emergency Department (HOSPITAL_COMMUNITY): Payer: BLUE CROSS/BLUE SHIELD

## 2014-12-23 ENCOUNTER — Encounter (HOSPITAL_COMMUNITY): Payer: Self-pay | Admitting: Emergency Medicine

## 2014-12-23 ENCOUNTER — Emergency Department (HOSPITAL_COMMUNITY)
Admission: EM | Admit: 2014-12-23 | Discharge: 2014-12-23 | Disposition: A | Discharge status: Home / Self Care | Payer: BLUE CROSS/BLUE SHIELD | Attending: Emergency Medicine | Admitting: Emergency Medicine

## 2014-12-23 DIAGNOSIS — Z79899 Other long term (current) drug therapy: Secondary | ICD-10-CM | POA: Insufficient documentation

## 2014-12-23 DIAGNOSIS — I10 Essential (primary) hypertension: Secondary | ICD-10-CM | POA: Insufficient documentation

## 2014-12-23 DIAGNOSIS — R569 Unspecified convulsions: Secondary | ICD-10-CM

## 2014-12-23 LAB — BASIC METABOLIC PANEL
Anion gap: 8 (ref 5–15)
BUN: 16 mg/dL (ref 6–23)
CO2: 24 mmol/L (ref 19–32)
Calcium: 9.4 mg/dL (ref 8.4–10.5)
Chloride: 105 mmol/L (ref 96–112)
Creatinine, Ser: 1.09 mg/dL (ref 0.50–1.35)
GFR calc Af Amer: >90 mL/min (ref >90)
GFR calc non Af Amer: 79 mL/min — ABNORMAL LOW (ref >90)
Glucose, Bld: 91 mg/dL (ref 70–99)
Potassium: 3.6 mmol/L (ref 3.5–5.1)
Sodium: 137 mmol/L (ref 135–145)

## 2014-12-23 LAB — CBC
HCT: 42.8 % (ref 39.0–52.0)
Hemoglobin: 14.7 g/dL (ref 13.0–17.0)
MCH: 30.2 pg (ref 26.0–34.0)
MCHC: 34.3 g/dL (ref 30.0–36.0)
MCV: 88.1 fL (ref 78.0–100.0)
Platelets: 164 10*3/uL (ref 150–400)
RBC: 4.86 MIL/uL (ref 4.22–5.81)
RDW: 12.5 % (ref 11.5–15.5)
WBC: 6.1 10*3/uL (ref 4.0–10.5)

## 2014-12-23 LAB — CBG MONITORING, ED: Glucose-Capillary: 86 mg/dL (ref 70–99)

## 2014-12-23 LAB — URINALYSIS, ROUTINE W REFLEX MICROSCOPIC
Bilirubin Urine: NEGATIVE
Glucose, UA: NEGATIVE mg/dL
Hgb urine dipstick: NEGATIVE
Ketones, ur: NEGATIVE mg/dL
Leukocytes, UA: NEGATIVE
Nitrite: NEGATIVE
Protein, ur: NEGATIVE mg/dL
Specific Gravity, Urine: 1.013 (ref 1.005–1.030)
Urobilinogen, UA: 0.2 mg/dL (ref 0.0–1.0)
pH: 6 (ref 5.0–8.0)

## 2014-12-23 NOTE — ED Provider Notes (Signed)
CSN: [id number]     Arrival date & time 12/23/14  0244 History   First MD Initiated Contact with Patient 12/23/14 0329     Chief Complaint  Patient presents with  . Seizures     (Consider location/radiation/quality/duration/timing/severity/associated sxs/prior Treatment) HPI  Jonathan [name] is a 49 y.o. male with past medical history of hypertension coming in after seizure like activity. Patient does not remember the event, history was obtained from the wife. The wife states around 2 AM when the patient was sleeping he experienced sudden onset shaking and grinding of his teeth. He then went completely stiff. Wife states this episode occurred for a total of 5-10 minutes. There is no incontinence, he did bite his tongue. Afterwards the wife describes a postictal state where the patient was awake but confused and did not know where he was. He denies any history of seizure in the past. Patient has been taking new herbal supplements over the last 5 days. He denies any recent infections. Patient has no further complaints.  10 Systems reviewed and are negative for acute change except as noted in the HPI.     Past Medical History  Diagnosis Date  . HTN (hypertension)    History reviewed. No pertinent past surgical history. Family History  Problem Relation Age of Onset  . Cancer Maternal Grandmother     breast   History  Substance Use Topics  . Smoking status: Never Smoker   . Smokeless tobacco: Not on file  . Alcohol Use: Yes    Review of Systems    Allergies  Bee venom  Home Medications   Prior to Admission medications   Medication Sig Start Date End Date Taking? Authorizing Provider  lisinopril-hydrochlorothiazide (PRINZIDE,ZESTORETIC) 20-12.5 MG per tablet Take 1 tablet by mouth daily.   Yes Historical Provider, MD  VALERIAN ROOT PO Take 3 tablets by mouth at bedtime as needed (sleep).   Yes Historical Provider, MD   BP 144/86 mmHg  Pulse 89  Temp(Src) 98 F (36.7 C)  (Oral)  Resp 14  SpO2 97% Physical Exam  Constitutional: He is oriented to person, place, and time. Vital signs are normal. He appears well-developed and well-nourished.  Non-toxic appearance. He does not appear ill. No distress.  HENT:  Head: Normocephalic and atraumatic.  Nose: Nose normal.  Mouth/Throat: Oropharynx is clear and moist. No oropharyngeal exudate.  Eyes: Conjunctivae and EOM are normal. Pupils are equal, round, and reactive to light. No scleral icterus.  Neck: Normal range of motion. Neck supple. No tracheal deviation, no edema, no erythema and normal range of motion present. No thyroid mass and no thyromegaly present.  Cardiovascular: Normal rate, regular rhythm, S1 normal, S2 normal, normal heart sounds, intact distal pulses and normal pulses.  Exam reveals no gallop and no friction rub.   No murmur heard. Pulses:      Radial pulses are 2+ on the right side, and 2+ on the left side.       Dorsalis pedis pulses are 2+ on the right side, and 2+ on the left side.  Pulmonary/Chest: Effort normal and breath sounds normal. No respiratory distress. He has no wheezes. He has no rhonchi. He has no rales.  Abdominal: Soft. Normal appearance and bowel sounds are normal. He exhibits no distension, no ascites and no mass. There is no hepatosplenomegaly. There is no tenderness. There is no rebound, no guarding and no CVA tenderness.  Musculoskeletal: Normal range of motion. He exhibits no edema or tenderness.  Lymphadenopathy:    He has no cervical adenopathy.  Neurological: He is alert and oriented to person, place, and time. He has normal strength. No cranial nerve deficit or sensory deficit.  Normal strength and sensationx4ext, normal cerebellar testing, normal gait  Skin: Skin is warm, dry and intact. No petechiae and no rash noted. He is not diaphoretic. No erythema. No pallor.  Psychiatric: He has a normal mood and affect. His behavior is normal. Judgment normal.  Nursing note and  vitals reviewed.   ED Course  Procedures (including critical care time) Labs Review Labs Reviewed  BASIC METABOLIC PANEL - Abnormal; Notable for the following:    GFR calc non Af Amer 79 (*)    All other components within normal limits  CBC  URINALYSIS, ROUTINE W REFLEX MICROSCOPIC  CBG MONITORING, ED    Imaging Review Dg Chest 2 View  12/23/2014   CLINICAL DATA:  Seizure  EXAM: CHEST  2 VIEW  COMPARISON:  None.  FINDINGS: A single AP portable view of the chest demonstrates no focal airspace consolidation or alveolar edema. The lungs are grossly clear. There is no large effusion or pneumothorax. Cardiac and mediastinal contours appear unremarkable.  IMPRESSION: No active cardiopulmonary disease.   Electronically Signed   By: Ellery Plunk M.D.   On: 12/23/2014 05:17   Ct Head Wo Contrast   (if New Onset Seizure And/or Head Trauma)  12/23/2014   CLINICAL DATA:  Possible seizures.  EXAM: CT HEAD WITHOUT CONTRAST  TECHNIQUE: Contiguous axial images were obtained from the base of the skull through the vertex without intravenous contrast.  COMPARISON:  None.  FINDINGS: There is no intracranial hemorrhage, mass or evidence of acute infarction. The brain and CSF spaces appear normal.  The bony structures are intact. Visible portions of the paranasal sinuses are clear.  IMPRESSION: Normal brain.   Electronically Signed   By: Ellery Plunk M.D.   On: 12/23/2014 03:54     EKG Interpretation   Date/Time:  Friday December 23 2014 04:48:17 EST Ventricular Rate:  89 PR Interval:  135 QRS Duration: 101 QT Interval:  386 QTC Calculation: 470 R Axis:   55 Text Interpretation:  Sinus rhythm Left ventricular hypertrophy Confirmed  by Erroll Luna [phone]) on 12/23/2014 5:01:43 AM      MDM   Final diagnoses:  Seizure    Patient presents to the ED for first time seizure.  CT head does not show an acute cause for this.  Infectious work up is negative.  Only new medications is the  herbal supplement he was taking for sleep.  Patient advised to discontinue this medication. Neurology follow up was given.  This is a first time seizure so medications are not warranted.  Return precautions given.  His VS remain within his normal limits and he is safe for DC    Tomasita Crumble, MD 12/23/14 [phone]

## 2014-12-23 NOTE — ED Notes (Signed)
Pt reports that he doesn't remember much after he ate dinner and does not remember going to bed. Pt states that he does remember a little bit about being in the ambulance.

## 2014-12-23 NOTE — Discharge Instructions (Signed)
Seizure, Adult Mr. Jonathan [name], you were seen today for a seizure. Please stop taking the herbal medicine as this may have caused her seizure. Your CT scan, chest x-ray and urine studies were all normal. Follow-up with neurology within 3 days for continued management. If this happens again come back to the emergency department immediately, you may require antiseizure medication. Thank you. A seizure means there is unusual activity in the brain. A seizure can cause changes in attention or behavior. Seizures often cause shaking (convulsions). Seizures often last from 30 seconds to 2 minutes. HOME CARE   If you are given medicines, take them exactly as told by your doctor.  Keep all doctor visits as told.  Do not swim or drive until your doctor says it is okay.  Teach others what to do if you have a seizure. They should:  Lay you on the ground.  Put a cushion under your head.  Loosen any tight clothing around your neck.  Turn you on your side.  Stay with you until you get better. GET HELP RIGHT AWAY IF:   The seizure lasts longer than 2 to 5 minutes.  The seizure is very bad.  The person does not wake up after the seizure.  The person's attention or behavior changes. Drive the person to the emergency room or call your local emergency services (911 in [place] MAKE SURE YOU:   Understand these instructions.  Will watch your condition.  Will get help right away if you are not doing well or get worse. Document Released: 04/08/2008 Document Revised: 01/13/2012 Document Reviewed: 10/09/2011 [facility] Patient Information 2015 [employer] [place]. This information is not intended to replace advice given to you by your health care provider. Make sure you discuss any questions you have with your health care provider.

## 2014-12-23 NOTE — ED Notes (Signed)
Per EMS: pt coming from home with c/o possible seizures. Pt's wife was awoke by a grunting noise by pt, noise lasted 30 seconds to a minute. Pt was post-ictle upon EMS arrival. Upon EMS arrival to ED, pt is A&Ox4 and back to baseline. Pt does not have a hx of seizures. cbg 113

## 2014-12-23 NOTE — ED Notes (Signed)
Pt getting dressed.

## 2014-12-28 ENCOUNTER — Ambulatory Visit (INDEPENDENT_AMBULATORY_CARE_PROVIDER_SITE_OTHER): Payer: BLUE CROSS/BLUE SHIELD | Admitting: Neurology

## 2014-12-28 ENCOUNTER — Encounter: Payer: Self-pay | Admitting: Neurology

## 2014-12-28 VITALS — BP 118/68 | HR 78 | Ht 66.0 in | Wt 142.0 lb

## 2014-12-28 DIAGNOSIS — R569 Unspecified convulsions: Secondary | ICD-10-CM | POA: Diagnosis not present

## 2014-12-28 DIAGNOSIS — G43109 Migraine with aura, not intractable, without status migrainosus: Secondary | ICD-10-CM

## 2014-12-28 NOTE — Progress Notes (Signed)
PATIENT: Jonathan [name] DOB: March 02, 1966  HISTORICAL  Jonathan [name] is a 49 yo RH male,, is referred by emergency room, and his primary care physician from [facility] family practice, for evaluation of seizure  In Feb 19th 2016, while in sleep, 2am, he was noticed by his wife having a seizure, grinding teeth, body stiffness, lasted for 3 minutes, followed by body limp, confusion afterward, he woke up in the ambulance, ED, he did bit his lip. He has no history of seizure, he complains of insomnia, recently started to take herb supplement. CT head wo in Feb 19th 2016 was normal. Lab, normal UA, CBC, BMP.  He has occasional visual distortion at his right visual field, with associated headaches, movement made it worse.  Intermittent, it can happen 2/day, or can happen every few month.  He had similar episode in Feb 18th 2016 afternoon, the day before he had seizure, no headache, transient moment loss touch, his friend noticed that his face went blank.   REVIEW OF SYSTEMS: Full 14 system review of systems performed and notable only for confusion, insomina  ALLERGIES: Allergies  Allergen Reactions  . Bee Venom     unknown    HOME MEDICATIONS: Current Outpatient Prescriptions  Medication Sig Dispense Refill  . lisinopril-hydrochlorothiazide (PRINZIDE,ZESTORETIC) 20-12.5 MG per tablet Take 1 tablet by mouth daily.     No current facility-administered medications for this visit.    PAST MEDICAL HISTORY: Past Medical History  Diagnosis Date  . HTN (hypertension)   . Seizure   . Bee sting allergy     PAST SURGICAL HISTORY: Past Surgical History  Procedure Laterality Date  . No past surgeries      FAMILY HISTORY: Family History  Problem Relation Age of Onset  . Cancer Maternal Grandmother     breast  . Hypertension Mother   . Heart attack Father     SOCIAL HISTORY:  History   Social History  . Marital Status: Married    Spouse Name: [name]  . Number of Children: 1    . Years of Education: 16   Occupational History  . [employer]     Self-employed   Social History Main Topics  . Smoking status: Never Smoker   . Smokeless tobacco: Not on file  . Alcohol Use: 0.0 oz/week    0 Standard drinks or equivalent per week     Comment: 5-6 drinks/week  . Drug Use: No  . Sexual Activity: Not on file   Other Topics Concern  . Not on file   Social History Narrative   Right-handed.   Lives at home with her wife.   2 cups caffeine/day.     PHYSICAL EXAM   Filed Vitals:   12/28/14 0852  BP: 118/68  Pulse: 78  Height:  (1.676 m)  Weight: 142 lb (64.411 kg)    Not recorded      Body mass index is 22.93 kg/(m^2).  PHYSICAL EXAMNIATION:  Gen: NAD, conversant, well nourised, obese, well groomed                     Cardiovascular: Regular rate rhythm, no peripheral edema, warm, nontender. Eyes: Conjunctivae clear without exudates or hemorrhage Neck: Supple, no carotid bruise. Pulmonary: Clear to auscultation bilaterally   NEUROLOGICAL EXAM:  MENTAL STATUS: Speech:    Speech is normal; fluent and spontaneous with normal comprehension.  Cognition:    The patient is oriented to person, place, and time;  recent and remote memory intact;     language fluent;     normal attention, concentration,     fund of knowledge.  CRANIAL NERVES: CN II: Visual fields are full to confrontation. Fundoscopic exam is normal with sharp discs and no vascular changes. Venous pulsations are present bilaterally. Pupils are 4 mm and briskly reactive to light. Visual acuity is 20/20 bilaterally. CN III, IV, VI: extraocular movement are normal. No ptosis. CN V: Facial sensation is intact to pinprick in all 3 divisions bilaterally. Corneal responses are intact.  CN VII: Face is symmetric with normal eye closure and smile. CN VIII: Hearing is normal to rubbing fingers CN IX, X: Palate elevates symmetrically. Phonation is normal. CN XI: Head turning  and shoulder shrug are intact CN XII: Tongue is midline with normal movements and no atrophy.  MOTOR: There is no pronator drift of out-stretched arms. Muscle bulk and tone are normal. Muscle strength is normal.   Shoulder abduction Shoulder external rotation Elbow flexion Elbow extension Wrist flexion Wrist extension Finger abduction Hip flexion Knee flexion Knee extension Ankle dorsi flexion Ankle plantar flexion  R 5 5 5 5 5 5 5 5 5 5 5 5   L 5 5 5 5 5 5 5 5 5 5 5 5     REFLEXES: Reflexes are 2+ and symmetric at the biceps, triceps, knees, and ankles. Plantar responses are flexor.  SENSORY: Light touch, pinprick, position sense, and vibration sense are intact in fingers and toes.  COORDINATION: Rapid alternating movements and fine finger movements are intact. There is no dysmetria on finger-to-nose and heel-knee-shin. There are no abnormal or extraneous movements.   GAIT/STANCE: Posture is normal. Gait is steady with normal steps, base, arm swing, and turning. Heel and toe walking are normal. Tandem gait is normal.  Romberg is absent.   DIAGNOSTIC DATA (LABS, IMAGING, TESTING) - I reviewed patient records, labs, notes, testing and imaging myself where available.  Lab Results  Component Value Date   WBC 6.1 12/23/2014   HGB 14.7 12/23/2014   HCT 42.8 12/23/2014   MCV 88.1 12/23/2014   PLT 164 12/23/2014      Component Value Date/Time   NA 137 12/23/2014 0304   K 3.6 12/23/2014 0304   CL 105 12/23/2014 0304   CO2 24 12/23/2014 0304   GLUCOSE 91 12/23/2014 0304   BUN 16 12/23/2014 0304   CREATININE 1.09 12/23/2014 0304   CALCIUM 9.4 12/23/2014 0304   PROT 6.9 01/29/2014 0840   ALBUMIN 3.7 01/29/2014 0840   AST 38* 01/29/2014 0840   ALT 42 01/29/2014 0840   ALKPHOS 72 01/29/2014 0840   BILITOT <0.2* 01/29/2014 0840   GFRNONAA 79* 12/23/2014 0304   GFRAA >90 12/23/2014 0304    ASSESSMENT AND PLAN  Jonathan [name] is a 49 y.o. male with past medical history of  migraine headaches, presenting with one generalized nocturnal seizure December 23 2014, there was also frequent episode of transient right visual distortion, lost connection with surroundings, suggestive of partial seizure, versus migraine with aura,  1, complete evaluation with MRI of the brain with and without contrast, EEG, 2. Hold of AED at this point 3. No driving until seizure free for 6 months, 4, return to clinic in few weeks with family members or friends, who has witnessed his episodes.   Jonathan [name], M.D. Ph.D.  [facility] [address], [address] [place], [place] [address] Ph: [phone] Fax: [phone]

## 2014-12-29 ENCOUNTER — Telehealth: Payer: Self-pay | Admitting: Neurology

## 2014-12-29 MED ORDER — TOPIRAMATE 100 MG PO TABS: 100.0000 mg | ORAL_TABLET | Freq: Two times a day (BID) | ORAL | Status: DC

## 2014-12-29 MED ORDER — LEVETIRACETAM 500 MG PO TABS: 500.0000 mg | ORAL_TABLET | Freq: Two times a day (BID) | ORAL | Status: DC

## 2014-12-29 NOTE — Telephone Encounter (Signed)
I have called patient, EEG showed short protrusion of spikes slow waves, consistent with generalized epileptiform discharge  I will restart Keppra 500 mg twice a day, potential side effect explained, he has a history of kidney stone, has frequent headaches consistent with migraine, but Topamax is no longer good choice.  Keep MRI, and follow-up scheduling March

## 2014-12-29 NOTE — Procedures (Addendum)
   HISTORY: [age] years old male, with new onset nocturnal seizure  TECHNIQUE:  16 channel EEG was performed based on standard 10-16 international system. One channel was dedicated to EKG, which has demonstrates normal sinus rhythm of 60 beats per minutes.  Upon awakening, the posterior background activity was well-developed, in alpha range 10 Hz, with amplitude of microvoltage, reactive to eye opening and closure. There was also intermittent frontal area smaller amplitude fast beta activity.   There was short protrusion of generalized spike and slow waves, consistent with epileptiform discharge. There was also intermittent sharp transient involving F3, T3, F4, C4  Leads.  Photic stimulation was performed, which induced a symmetric photic driving.  Hyperventilation was performed, there was increased and recurrence of generalized spike slow waves.  Stage II sleep was achieved.  CONCLUSION: This is  an abnormal EEG.  There was electrodiagnostic evidence of  generalized epileptiform discharge

## 2015-01-05 ENCOUNTER — Ambulatory Visit (INDEPENDENT_AMBULATORY_CARE_PROVIDER_SITE_OTHER): Payer: BLUE CROSS/BLUE SHIELD

## 2015-01-05 DIAGNOSIS — R569 Unspecified convulsions: Secondary | ICD-10-CM | POA: Diagnosis not present

## 2015-01-05 DIAGNOSIS — G43109 Migraine with aura, not intractable, without status migrainosus: Secondary | ICD-10-CM | POA: Diagnosis not present

## 2015-01-05 MED ORDER — GADOPENTETATE DIMEGLUMINE 469.01 MG/ML IV SOLN: 10.0000 mL | Freq: Once | INTRAVENOUS | Status: AC | PRN

## 2015-01-09 ENCOUNTER — Telehealth: Payer: Self-pay | Admitting: Neurology

## 2015-01-09 NOTE — Telephone Encounter (Signed)
Jonathan [name]:  Please call patient, MRI brain showed no significant abnormalities.

## 2015-01-09 NOTE — Telephone Encounter (Signed)
Pt aware of results and will keep appt tomorrow.

## 2015-01-10 ENCOUNTER — Ambulatory Visit (INDEPENDENT_AMBULATORY_CARE_PROVIDER_SITE_OTHER): Payer: BLUE CROSS/BLUE SHIELD | Admitting: Neurology

## 2015-01-10 ENCOUNTER — Encounter: Payer: Self-pay | Admitting: Neurology

## 2015-01-10 VITALS — BP 116/78 | HR 86 | Ht 66.0 in | Wt 142.0 lb

## 2015-01-10 DIAGNOSIS — G40309 Generalized idiopathic epilepsy and epileptic syndromes, not intractable, without status epilepticus: Secondary | ICD-10-CM | POA: Diagnosis not present

## 2015-01-10 MED ORDER — DIVALPROEX SODIUM ER 500 MG PO TB24: 1000.0000 mg | ORAL_TABLET | Freq: Every day | ORAL | Status: DC

## 2015-01-10 NOTE — Progress Notes (Signed)
PATIENT: Jonathan [name] DOB: 01-05-1966  HISTORICAL  Jonathan [name] is a 49 yo RH male,, is referred by emergency room, and his primary care physician from [facility] family practice, for evaluation of seizure  In Feb 19th 2016, while in sleep, 2am, he was noticed by his wife having a seizure, grinding teeth, body stiffness, lasted for 3 minutes, followed by body limp, confusion afterward, he woke up in the ambulance, ED, he did bit his lip. He has no history of seizure, he complains of insomnia, recently started to take herb supplement. CT head wo in Feb 19th 2016 was normal. Lab, normal UA, CBC, BMP.  He has occasional visual distortion at his right visual field, with associated headaches, movement made it worse.  Intermittent, it can happen 2/day, or can happen every few month.  He had similar episode in Feb 18th 2016 afternoon, the day before he had seizure, no headache, transient moment loss touch, his friend noticed that his face went blank.  UPDATE March 8th 2016: He is with his wife today, he complains of episodes of sudden onset confusions, difficulty talking, blurry vision, sometimes with flashing light, lasting for 1-2 minute. 2-3 times each week. His wife witness that he suddenly gets very quiet, still, confused, could not answer questions, last for 1-2 minutes. MRI of the brain was essentially normal, EEG showed generalized epileptiform discharges. I have started Keppra 500 mg twice a day December 29 2014 over the phone, he tolerates the medication well, but there was no significant change on the frequency of his confusion spells,   REVIEW OF SYSTEMS: Full 14 system review of systems performed and notable only for as above  ALLERGIES: Allergies  Allergen Reactions  . Bee Venom     unknown    HOME MEDICATIONS: Current Outpatient Prescriptions  Medication Sig Dispense Refill  . levETIRAcetam (KEPPRA) 500 MG tablet Take 1 tablet (500 mg total) by mouth 2 (two) times  daily. 60 tablet 11  . lisinopril-hydrochlorothiazide (PRINZIDE,ZESTORETIC) 20-12.5 MG per tablet Take 1 tablet by mouth daily.     No current facility-administered medications for this visit.    PAST MEDICAL HISTORY: Past Medical History  Diagnosis Date  . HTN (hypertension)   . Seizure   . Bee sting allergy     PAST SURGICAL HISTORY: Past Surgical History  Procedure Laterality Date  . No past surgeries      FAMILY HISTORY: Family History  Problem Relation Age of Onset  . Cancer Maternal Grandmother     breast  . Hypertension Mother   . Heart attack Father     SOCIAL HISTORY:  History   Social History  . Marital Status: Married    Spouse Name: [name]  . Number of Children: 1  . Years of Education: 16   Occupational History  . [employer]     Self-employed   Social History Main Topics  . Smoking status: Never Smoker   . Smokeless tobacco: Not on file  . Alcohol Use: 0.0 oz/week    0 Standard drinks or equivalent per week     Comment: 5-6 drinks/week  . Drug Use: No  . Sexual Activity: Not on file   Other Topics Concern  . Not on file   Social History Narrative   Right-handed.   Lives at home with her wife.   2 cups caffeine/day.     PHYSICAL EXAM   Filed Vitals:   01/10/15 0947  BP: 116/78  Pulse: 86  Height:  (1.676 m)  Weight: 142 lb (64.411 kg)    Not recorded      Body mass index is 22.93 kg/(m^2).  PHYSICAL EXAMNIATION:  Gen: NAD, conversant, well nourised, obese, well groomed                     Cardiovascular: Regular rate rhythm, no peripheral edema, warm, nontender. Eyes: Conjunctivae clear without exudates or hemorrhage Neck: Supple, no carotid bruise. Pulmonary: Clear to auscultation bilaterally   NEUROLOGICAL EXAM:  MENTAL STATUS: Speech:    Speech is normal; fluent and spontaneous with normal comprehension.  Cognition:    The patient is oriented to person, place, and time;     recent and remote  memory intact;     language fluent;     normal attention, concentration,     fund of knowledge.  CRANIAL NERVES: CN II: Visual fields are full to confrontation. Fundoscopic exam is normal with sharp discs and no vascular changes. Venous pulsations are present bilaterally. Pupils are 4 mm and briskly reactive to light. Visual acuity is 20/20 bilaterally. CN III, IV, VI: extraocular movement are normal. No ptosis. CN V: Facial sensation is intact to pinprick in all 3 divisions bilaterally. Corneal responses are intact.  CN VII: Face is symmetric with normal eye closure and smile. CN VIII: Hearing is normal to rubbing fingers CN IX, X: Palate elevates symmetrically. Phonation is normal. CN XI: Head turning and shoulder shrug are intact CN XII: Tongue is midline with normal movements and no atrophy.  MOTOR: There is no pronator drift of out-stretched arms. Muscle bulk and tone are normal. Muscle strength is normal.   Shoulder abduction Shoulder external rotation Elbow flexion Elbow extension Wrist flexion Wrist extension Finger abduction Hip flexion Knee flexion Knee extension Ankle dorsi flexion Ankle plantar flexion  R L REFLEXES: Reflexes are 2+ and symmetric at the biceps, triceps, knees, and ankles. Plantar responses are flexor.  SENSORY: Light touch, pinprick, position sense, and vibration sense are intact in fingers and toes.  COORDINATION: Rapid alternating movements and fine finger movements are intact. There is no dysmetria on finger-to-nose and heel-knee-shin. There are no abnormal or extraneous movements.   GAIT/STANCE: Posture is normal. Gait is steady with normal steps, base, arm swing, and turning. Heel and toe walking are normal. Tandem gait is normal.  Romberg is absent.   DIAGNOSTIC DATA (LABS, IMAGING, TESTING) - I reviewed patient records, labs, notes, testing and imaging myself where available.  Lab Results    Component Value Date   WBC 6.1 12/23/2014   HGB 14.7 12/23/2014   HCT 42.8 12/23/2014   MCV 88.1 12/23/2014   PLT 164 12/23/2014      Component Value Date/Time   NA 137 12/23/2014 0304   K 3.6 12/23/2014 0304   CL 105 12/23/2014 0304   CO2 24 12/23/2014 0304   GLUCOSE 91 12/23/2014 0304   BUN 16 12/23/2014 0304   CREATININE 1.09 12/23/2014 0304   CALCIUM 9.4 12/23/2014 0304   PROT 6.9 01/29/2014 0840   ALBUMIN 3.7 01/29/2014 0840   AST 38* 01/29/2014 0840   ALT 42 01/29/2014 0840   ALKPHOS 72 01/29/2014 0840   BILITOT <0.2* 01/29/2014 0840   GFRNONAA 79* 12/23/2014 0304   GFRAA >90 12/23/2014 0304    ASSESSMENT AND PLAN  Robin SearingJohn P Mays is a 49 y.o. male with past medical history of migraine headaches, presenting with one nocturnal seizure December 23 2014, he has frequent episode of transient confusion, abnormal EEG, consistent with generalized epileptiform discharge, Consistent with idiopathic generalized epilepsy disorder,  Normal MRI of the brain, he complains of insomnia, anxiety, there was no significant change of confusion episodes while taking Keppra 500 mg twice a day,  1. Will try Depakote ER 500 mg 2 tablets every night 2, tapering off Keppra 3, no driving until seizure free in 6 months, last seizure was December 23 2014 4, return to clinic in 2 months, will check CBC, CMP, Depakote level X.   Levert FeinsteinYijun Ronny Korff, M.D. Ph.D.  [facility] [address], [address] [place], [place] [address] Ph: [phone](336) [phone] Fax: [phone]

## 2015-01-12 ENCOUNTER — Ambulatory Visit: Payer: BLUE CROSS/BLUE SHIELD | Admitting: Neurology

## 2015-02-01 ENCOUNTER — Ambulatory Visit: Payer: BLUE CROSS/BLUE SHIELD | Admitting: Neurology

## 2015-02-06 ENCOUNTER — Telehealth: Payer: Self-pay | Admitting: Neurology

## 2015-02-06 NOTE — Telephone Encounter (Signed)
ER REFERRAL / pt no showed NP appt w/ Dr. Karel JarvisAquino on 02/01/15. Pt schedule appt w/ [facility] and [facility]. He is currently treating w/ [facility] / Sherri S.

## 2015-03-16 ENCOUNTER — Encounter: Payer: Self-pay | Admitting: Neurology

## 2015-03-16 ENCOUNTER — Ambulatory Visit (INDEPENDENT_AMBULATORY_CARE_PROVIDER_SITE_OTHER): Payer: BLUE CROSS/BLUE SHIELD | Admitting: Neurology

## 2015-03-16 VITALS — BP 105/62 | HR 66 | Ht 66.0 in | Wt 142.0 lb

## 2015-03-16 DIAGNOSIS — G43109 Migraine with aura, not intractable, without status migrainosus: Secondary | ICD-10-CM | POA: Diagnosis not present

## 2015-03-16 DIAGNOSIS — G40309 Generalized idiopathic epilepsy and epileptic syndromes, not intractable, without status epilepticus: Secondary | ICD-10-CM

## 2015-03-16 MED ORDER — DIVALPROEX SODIUM ER 500 MG PO TB24: 1000.0000 mg | ORAL_TABLET | Freq: Every day | ORAL | Status: DC

## 2015-03-16 NOTE — Progress Notes (Signed)
Chief Complaint  Patient presents with  . Seizures    Says he is feeling better since stopping Keppra and starting Depakote ER.  Reports no further seizure activity.      PATIENT: Jonathan [name] DOB: 1966/04/24  HISTORICAL  Jonathan [name] is a 49 yo RH male,, is referred by emergency room, and his primary care physician from [facility] family practice, for evaluation of seizure  In Feb 19th 2016, while in sleep, 2am, he was noticed by his wife having a seizure, grinding teeth, body stiffness, lasted for 3 minutes, followed by body limp, confusion afterward, he woke up in the ambulance, ED, he did bit his lip. He has no history of seizure, he complains of insomnia, recently started to take herb supplement. CT head wo in Feb 19th 2016 was normal. Lab, normal UA, CBC, BMP.  He has occasional visual distortion at his right visual field, with associated headaches, movement made it worse.  Intermittent, it can happen 2/day, or can happen every few month.  He had similar episode in Feb 18th 2016 afternoon, the day before he had seizure, no headache, transient moment loss touch, his friend noticed that his face went blank.  UPDATE March 8th 2016: He is with his wife today, he complains of episodes of sudden onset confusions, difficulty talking, blurry vision, sometimes with flashing light, lasting for 1-2 minute. 2-3 times each week. His wife witness that he suddenly gets very quiet, still, confused, could not answer questions, last for 1-2 minutes. MRI of the brain was essentially normal, EEG showed generalized epileptiform discharges. I have started Keppra 500 mg twice a day December 29 2014 over the phone, he tolerates the medication well, but there was no significant change on the frequency of his confusion spells  UPDATE Mar 16 2015: He is doing well, tolerating Depakote ER  2 tabs po qhs,he can sleep better, he has not had visual distortion, no confusion episode,no seizure,   REVIEW OF  SYSTEMS: Full 14 system review of systems performed and notable only for as above  ALLERGIES: Allergies  Allergen Reactions  . Bee Venom     unknown    HOME MEDICATIONS: Current Outpatient Prescriptions  Medication Sig Dispense Refill  . divalproex (DEPAKOTE ER) 500 MG 24 hr tablet Take 2 tablets (1,000 mg total) by mouth daily. 60 tablet 11  . lisinopril-hydrochlorothiazide (PRINZIDE,ZESTORETIC) 20-12.5 MG per tablet Take 1 tablet by mouth daily.     No current facility-administered medications for this visit.    PAST MEDICAL HISTORY: Past Medical History  Diagnosis Date  . HTN (hypertension)   . Seizure   . Bee sting allergy     PAST SURGICAL HISTORY: Past Surgical History  Procedure Laterality Date  . No past surgeries      FAMILY HISTORY: Family History  Problem Relation Age of Onset  . Cancer Maternal Grandmother     breast  . Hypertension Mother   . Heart attack Father     SOCIAL HISTORY:  History   Social History  . Marital Status: Married    Spouse Name: [name]  . Number of Children: 1  . Years of Education: 16   Occupational History  . [employer]     Self-employed   Social History Main Topics  . Smoking status: Never Smoker   . Smokeless tobacco: Not on file  . Alcohol Use: 0.0 oz/week    0 Standard drinks or equivalent per week     Comment: 5-6 drinks/week  .  Drug Use: No  . Sexual Activity: Not on file   Other Topics Concern  . Not on file   Social History Narrative   Right-handed.   Lives at home with her wife.   2 cups caffeine/day.     PHYSICAL EXAM   Filed Vitals:   03/16/15 0957  BP: 105/62  Pulse: 66  Height: 5\' 6"  (1.676 m)  Weight: 142 lb (64.411 kg)    Not recorded      Body mass index is 22.93 kg/(m^2).  PHYSICAL EXAMNIATION:  Gen: NAD, conversant, well nourised, obese, well groomed                     Cardiovascular: Regular rate rhythm, no peripheral edema, warm, nontender. Eyes: Conjunctivae  clear without exudates or hemorrhage Neck: Supple, no carotid bruise. Pulmonary: Clear to auscultation bilaterally   NEUROLOGICAL EXAM:  MENTAL STATUS: Speech:    Speech is normal; fluent and spontaneous with normal comprehension.  Cognition:    The patient is oriented to person, place, and time;     recent and remote memory intact;     language fluent;     normal attention, concentration,     fund of knowledge.  CRANIAL NERVES: CN II: Visual fields are full to confrontation. Fundoscopic exam is normal with sharp discs and no vascular changes. Venous pulsations are present bilaterally. Pupils are 4 mm and briskly reactive to light. Visual acuity is 20/20 bilaterally. CN III, IV, VI: extraocular movement are normal. No ptosis. CN V: Facial sensation is intact to pinprick in all 3 divisions bilaterally. Corneal responses are intact.  CN VII: Face is symmetric with normal eye closure and smile. CN VIII: Hearing is normal to rubbing fingers CN IX, X: Palate elevates symmetrically. Phonation is normal. CN XI: Head turning and shoulder shrug are intact CN XII: Tongue is midline with normal movements and no atrophy.  MOTOR: There is no pronator drift of out-stretched arms. Muscle bulk and tone are normal. Muscle strength is normal.   Shoulder abduction Shoulder external rotation Elbow flexion Elbow extension Wrist flexion Wrist extension Finger abduction Hip flexion Knee flexion Knee extension Ankle dorsi flexion Ankle plantar flexion  R 5 5 5 5 5 5 5 5 5 5 5 5   L 5 5 5 5 5 5 5 5 5 5 5 5     REFLEXES: Reflexes are 2+ and symmetric at the biceps, triceps, knees, and ankles. Plantar responses are flexor.  SENSORY: Light touch, pinprick, position sense, and vibration sense are intact in fingers and toes.  COORDINATION: Rapid alternating movements and fine finger movements are intact. There is no dysmetria on finger-to-nose and heel-knee-shin. There are no abnormal or extraneous  movements.   GAIT/STANCE: Posture is normal. Gait is steady with normal steps, base, arm swing, and turning. Heel and toe walking are normal. Tandem gait is normal.  Romberg is absent.   DIAGNOSTIC DATA (LABS, IMAGING, TESTING) - I reviewed patient records, labs, notes, testing and imaging myself where available.  Lab Results  Component Value Date   WBC 6.1 12/23/2014   HGB 14.7 12/23/2014   HCT 42.8 12/23/2014   MCV 88.1 12/23/2014   PLT 164 12/23/2014      Component Value Date/Time   NA 137 12/23/2014 0304   K 3.6 12/23/2014 0304   CL 105 12/23/2014 0304   CO2 24 12/23/2014 0304   GLUCOSE 91 12/23/2014 0304   BUN 16 12/23/2014 0304   CREATININE 1.09 12/23/2014  0304   CALCIUM 9.4 12/23/2014 0304   PROT 6.9 01/29/2014 0840   ALBUMIN 3.7 01/29/2014 0840   AST 38* 01/29/2014 0840   ALT 42 01/29/2014 0840   ALKPHOS 72 01/29/2014 0840   BILITOT <0.2* 01/29/2014 0840   GFRNONAA 79* 12/23/2014 0304   GFRAA >90 12/23/2014 0304    ASSESSMENT AND PLAN  Jonathan [name] is a 49 y.o. male with past medical history of migraine headaches, presenting with one nocturnal seizure December 23 2014, he has frequent episode of transient confusion, abnormal EEG, consistent with generalized epileptiform discharge, Consistent with idiopathic generalized epilepsy disorder, no significant abnormality on MRI of the brain, he complains of insomnia, anxiety, there was no significant change of confusion episodes while taking Keppra 500 mg twice a day,tolerating Depakote ER 500 mg 2 tablets every night much better, there was no recurrent seizure, headache has improved,  1. keep Depakote ER 500 mg 2 tablets every night 2 no driving until seizure free in 6 months, last seizure was December 23 2014 3. check CBC, CMP, Depakote level, we will call him report Fall, return to clinic with Eber Jonesarolyn in 6 months  Levert FeinsteinYijun Carita Sollars, M.D. Ph.D.  [facility] [address], [address] [place], [place]  [address] Ph: [phone](336) [phone] Fax: [phone]

## 2015-03-17 LAB — COMPREHENSIVE METABOLIC PANEL
ALT: 20 IU/L (ref 0–44)
AST: 26 IU/L (ref 0–40)
Albumin/Globulin Ratio: 1.7 (ref 1.1–2.5)
Albumin: 4.3 g/dL (ref 3.5–5.5)
Alkaline Phosphatase: 62 IU/L (ref 39–117)
BUN/Creatinine Ratio: 16 (ref 9–20)
BUN: 17 mg/dL (ref 6–24)
Bilirubin Total: 0.3 mg/dL (ref 0.0–1.2)
CO2: 25 mmol/L (ref 18–29)
Calcium: 9.6 mg/dL (ref 8.7–10.2)
Chloride: 97 mmol/L (ref 97–108)
Creatinine, Ser: 1.06 mg/dL (ref 0.76–1.27)
GFR calc Af Amer: 96 mL/min/{1.73_m2} (ref >59)
GFR calc non Af Amer: 83 mL/min/{1.73_m2} (ref >59)
Globulin, Total: 2.5 g/dL (ref 1.5–4.5)
Glucose: 70 mg/dL (ref 65–99)
Potassium: 4.5 mmol/L (ref 3.5–5.2)
Sodium: 138 mmol/L (ref 134–144)
Total Protein: 6.8 g/dL (ref 6.0–8.5)

## 2015-03-17 LAB — CBC WITH DIFFERENTIAL/PLATELET
Basophils Absolute: 0.1 10*3/uL (ref 0.0–0.2)
Basos: 2 %
EOS (ABSOLUTE): 0.3 10*3/uL (ref 0.0–0.4)
Eos: 7 %
Hematocrit: 42.3 % (ref 37.5–51.0)
Hemoglobin: 14 g/dL (ref 12.6–17.7)
Immature Grans (Abs): 0 10*3/uL (ref 0.0–0.1)
Immature Granulocytes: 0 %
Lymphocytes Absolute: 1.3 10*3/uL (ref 0.7–3.1)
Lymphs: 35 %
MCH: 31.6 pg (ref 26.6–33.0)
MCHC: 33.1 g/dL (ref 31.5–35.7)
MCV: 96 fL (ref 79–97)
Monocytes Absolute: 0.5 10*3/uL (ref 0.1–0.9)
Monocytes: 13 %
Neutrophils Absolute: 1.6 10*3/uL (ref 1.4–7.0)
Neutrophils: 43 %
Platelets: 148 10*3/uL — ABNORMAL LOW (ref 150–379)
RBC: 4.43 x10E6/uL (ref 4.14–5.80)
RDW: 14.3 % (ref 12.3–15.4)
WBC: 3.6 10*3/uL (ref 3.4–10.8)

## 2015-03-17 LAB — VALPROIC ACID LEVEL: Valproic Acid Lvl: 70 ug/mL (ref 50–100)

## 2015-09-19 ENCOUNTER — Ambulatory Visit (INDEPENDENT_AMBULATORY_CARE_PROVIDER_SITE_OTHER): Payer: BLUE CROSS/BLUE SHIELD | Admitting: Nurse Practitioner

## 2015-09-19 ENCOUNTER — Encounter: Payer: Self-pay | Admitting: Nurse Practitioner

## 2015-09-19 VITALS — BP 124/76 | HR 60 | Ht 67.0 in | Wt 152.4 lb

## 2015-09-19 DIAGNOSIS — R569 Unspecified convulsions: Secondary | ICD-10-CM

## 2015-09-19 DIAGNOSIS — G43109 Migraine with aura, not intractable, without status migrainosus: Secondary | ICD-10-CM | POA: Diagnosis not present

## 2015-09-19 NOTE — Progress Notes (Signed)
[facility]  PATIENT: Jonathan [name] DOB: 12-05-1965   REASON FOR VISIT: Follow-up for seizure disorder and migraines HISTORY FROM: Patient    HISTORY OF PRESENT ILLNESS: HISTORY:Jonathan [name] is a 49 yo RH male,, is referred by emergency room, and his primary care physician from [facility], for evaluation of seizure In Feb 19th 2016, while in sleep, 2am, he was noticed by his wife having a seizure, grinding teeth, body stiffness, lasted for 3 minutes, followed by body limp, confusion afterward, he woke up in the ambulance, ED, he did bit his lip. He has no history of seizure, he complains of insomnia, recently started to take herb supplement. CT head wo in Feb 19th 2016 was normal. Lab, normal UA, CBC, BMP. He has occasional visual distortion at his right visual field, with associated headaches, movement made it worse. Intermittent, it can happen 2/day, or can happen every few month. He had similar episode in Feb 18th 2016 afternoon, the day before he had seizure, no headache, transient moment loss touch, his friend noticed that his face went blank.  UPDATE March 8th 2016: He is with his wife today, he complains of episodes of sudden onset confusions, difficulty talking, blurry vision, sometimes with flashing light, lasting for 1-2 minute. 2-3 times each week. His wife witness that he suddenly gets very quiet, still, confused, could not answer questions, last for 1-2 minutes. MRI of the brain was essentially normal, EEG showed generalized epileptiform discharges. I have started Keppra 500 mg twice a day December 29 2014 over the phone, he tolerates the medication well, but there was no significant change on the frequency of his confusion spells UPDATE Mar 16 2015: He is doing well, tolerating Depakote ER  2 tabs po qhs,he can sleep better, he has not had visual distortion, no confusion episode,no seizure,  UPDATE 09/19/2015 Jonathan [name],  49 year old returns to follow-up for seizure disorder and migraines. He has been followed in the office previously by Dr. Terrace Arabia who  last saw him 03/16/2015. He is currently on Depakote ER 500 mg 2 tabs at bedtime. He had a mildly low platelet count at his last visit which will be repeated today. He denies any recent headaches. Last seizure activity  12/22/2014. He returned to driving in August. He has no new complaints  REVIEW OF SYSTEMS: Full 14 system review of systems performed and notable only for those listed, all others are neg:  Constitutional: neg  Cardiovascular: neg Ear/Nose/Throat: neg  Skin: neg Eyes: neg Respiratory: neg Gastroitestinal: neg  Hematology/Lymphatic: neg  Endocrine: neg Musculoskeletal:neg Allergy/Immunology: neg Neurological: neg Psychiatric: neg Sleep : neg   ALLERGIES: Allergies  Allergen Reactions  . Bee Venom     unknown    HOME MEDICATIONS: Outpatient Prescriptions Prior to Visit  Medication Sig Dispense Refill  . divalproex (DEPAKOTE ER) 500 MG 24 hr tablet Take 2 tablets (1,000 mg total) by mouth daily. 180 tablet 3  . lisinopril-hydrochlorothiazide (PRINZIDE,ZESTORETIC) 20-12.5 MG per tablet Take 1 tablet by mouth daily.     No facility-administered medications prior to visit.    PAST MEDICAL HISTORY: Past Medical History  Diagnosis Date  . HTN (hypertension)   . Seizure (HCC)   . Bee sting allergy     PAST SURGICAL HISTORY: Past Surgical History  Procedure Laterality Date  . No past surgeries      FAMILY HISTORY: Family History  Problem Relation Age of Onset  . Cancer Maternal Grandmother     breast  .  Hypertension Mother   . Heart attack Father     SOCIAL HISTORY: Social History   Social History  . Marital Status: Married    Spouse Name: Maralyn SagoSarah  . Number of Children: 1  . Years of Education: 16   Occupational History  . [employer]     Self-employed   Social History Main Topics  . Smoking status:  Never Smoker   . Smokeless tobacco: Not on file  . Alcohol Use: 0.0 oz/week    0 Standard drinks or equivalent per week     Comment: 5-6 drinks/week  . Drug Use: No  . Sexual Activity: Not on file   Other Topics Concern  . Not on file   Social History Narrative   Right-handed.   Lives at home with her wife.   2 cups caffeine/day.   One child.     PHYSICAL EXAM  Filed Vitals:   09/19/15 0954  BP: 124/76  Pulse: 60  Height: 5\' 7"  (1.702 m)  Weight: 152 lb 6.4 oz (69.128 kg)   Body mass index is 23.86 kg/(m^2).  Generalized: Well developed, in no acute distress  Head: normocephalic and atraumatic,. Oropharynx benign  Neck: Supple, no carotid bruits  Cardiac: Regular rate rhythm, no murmur  Musculoskeletal: No deformity   Neurological examination   Mentation: Alert oriented to time, place, history taking. Attention span and concentration appropriate. Recent and remote memory intact.  Follows all commands speech and language fluent.   Cranial nerve II-XII: Fundoscopic exam reveals sharp disc margins.Pupils were equal round reactive to light extraocular movements were full, visual field were full on confrontational test. Facial sensation and strength were normal. hearing was intact to finger rubbing bilaterally. Uvula tongue midline. head turning and shoulder shrug were normal and symmetric.Tongue protrusion into cheek strength was normal. Motor: normal bulk and tone, full strength in the BUE, BLE, fine finger movements normal, no pronator drift. No focal weakness Sensory: normal and symmetric to light touch, pinprick, and  Vibration, proprioception  Coordination: finger-nose-finger, heel-to-shin bilaterally, no dysmetria Reflexes: Brachioradialis 2/2, biceps 2/2, triceps 2/2, patellar 2/2, Achilles 2/2, plantar responses were flexor bilaterally. Gait and Station: Rising up from seated position without assistance, normal stance,  moderate stride, good arm swing, smooth turning,  able to perform tiptoe, and heel walking without difficulty. Tandem gait is steady  DIAGNOSTIC DATA (LABS, IMAGING, TESTING) - I reviewed patient records, labs, notes, testing and imaging myself where available.  Lab Results  Component Value Date   WBC 3.6 03/16/2015   HGB 14.7 12/23/2014   HCT 42.3 03/16/2015   MCV 88.1 12/23/2014   PLT 164 12/23/2014      Component Value Date/Time   NA 138 03/16/2015 1017   NA 137 12/23/2014 0304   K 4.5 03/16/2015 1017   CL 97 03/16/2015 1017   CO2 25 03/16/2015 1017   GLUCOSE 70 03/16/2015 1017   GLUCOSE 91 12/23/2014 0304   BUN 17 03/16/2015 1017   BUN 16 12/23/2014 0304   CREATININE 1.06 03/16/2015 1017   CALCIUM 9.6 03/16/2015 1017   PROT 6.8 03/16/2015 1017   PROT 6.9 01/29/2014 0840   ALBUMIN 4.3 03/16/2015 1017   ALBUMIN 3.7 01/29/2014 0840   AST 26 03/16/2015 1017   ALT 20 03/16/2015 1017   ALKPHOS 62 03/16/2015 1017   BILITOT 0.3 03/16/2015 1017   BILITOT <0.2* 01/29/2014 0840   GFRNONAA 83 03/16/2015 1017   GFRAA 96 03/16/2015 1017    ASSESSMENT AND PLAN  49  y.o. year old male  has a past medical history of HTN (hypertension); Seizure (HCC); and migraine headaches here to follow-up.  Plan Continue Depakote at current dose does not need refills, this is for seizure disorder and migraine prevention Check CBC today due to mildly decreased platelet count 6 months ago Follow-up in 6 months Nilda Riggs, [facility], [facility], APRN  [facility] [address], [address] [place], [place] [address] [phone]

## 2015-09-19 NOTE — Patient Instructions (Signed)
Continue Depakote at current dose does not need refills Check CBC today due to mildly decreased platelet count 6 months ago Follow-up in 6 months

## 2015-09-20 ENCOUNTER — Telehealth: Payer: Self-pay | Admitting: *Deleted

## 2015-09-20 LAB — CBC WITH DIFFERENTIAL/PLATELET
Basophils Absolute: 0 10*3/uL (ref 0.0–0.2)
Basos: 1 %
EOS (ABSOLUTE): 0.2 10*3/uL (ref 0.0–0.4)
Eos: 4 %
Hematocrit: 40.2 % (ref 37.5–51.0)
Hemoglobin: 13.8 g/dL (ref 12.6–17.7)
Immature Grans (Abs): 0 10*3/uL (ref 0.0–0.1)
Immature Granulocytes: 0 %
Lymphocytes Absolute: 1.8 10*3/uL (ref 0.7–3.1)
Lymphs: 36 %
MCH: 32.5 pg (ref 26.6–33.0)
MCHC: 34.3 g/dL (ref 31.5–35.7)
MCV: 95 fL (ref 79–97)
Monocytes Absolute: 0.8 10*3/uL (ref 0.1–0.9)
Monocytes: 15 %
Neutrophils Absolute: 2.2 10*3/uL (ref 1.4–7.0)
Neutrophils: 44 %
Platelets: 145 10*3/uL — ABNORMAL LOW (ref 150–379)
RBC: 4.24 x10E6/uL (ref 4.14–5.80)
RDW: 13.4 % (ref 12.3–15.4)
WBC: 5 10*3/uL (ref 3.4–10.8)

## 2015-09-20 NOTE — Telephone Encounter (Signed)
-----   Message from Nilda RiggsNancy Carolyn Martin, NP sent at 09/20/2015 10:48 AM EST ----- Platelet count stable please call the patient

## 2015-09-20 NOTE — Telephone Encounter (Signed)
I called and spoke to wife, Maralyn SagoSarah.  Relayed that the lab work stable.  (platelets were stable from 6 mo ago).  No additional labwork needed.  She would relay to pt.

## 2015-09-27 NOTE — Progress Notes (Signed)
I have reviewed and agreed above plan. 

## 2015-10-26 IMAGING — US US RENAL
1 series · 14 of 25 positions shown · non-contrast
Comparison: None.

CLINICAL DATA: Right-sided flank pain.

EXAM:
RENAL/URINARY TRACT ULTRASOUND COMPLETE

[Series 1: us renal · 0.21mm/px · 14 of 54 slices shown]
[im 1/54]
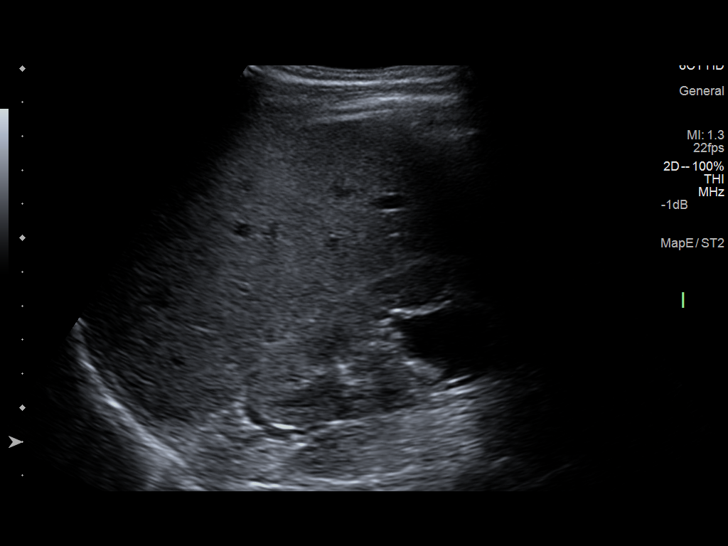
[im 5/54]
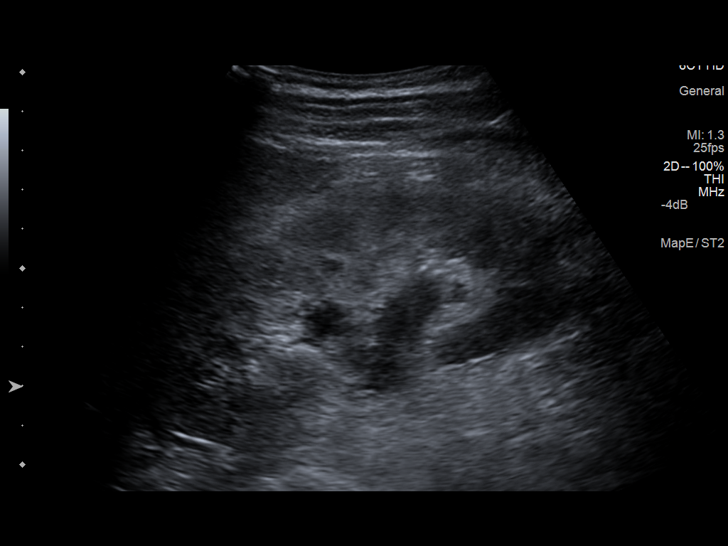
[im 9/54]
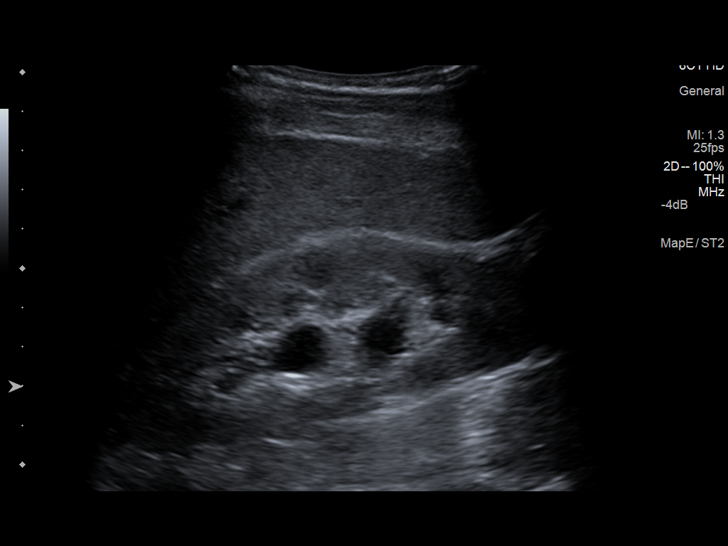
[im 14/54]
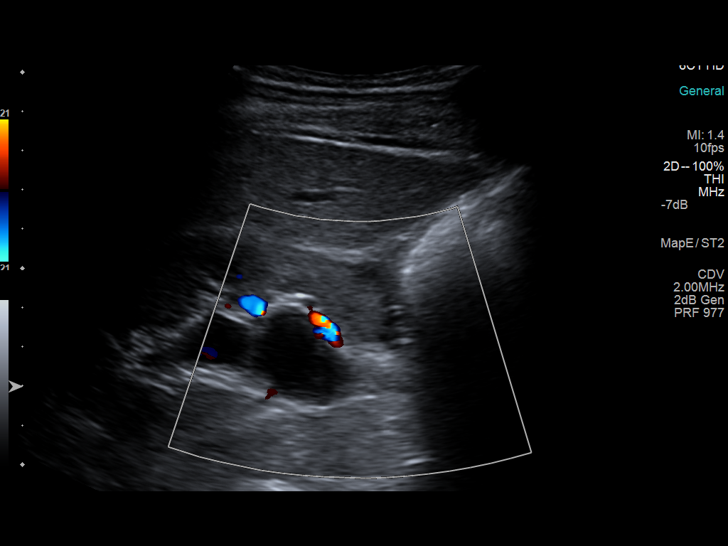
[im 18/54]
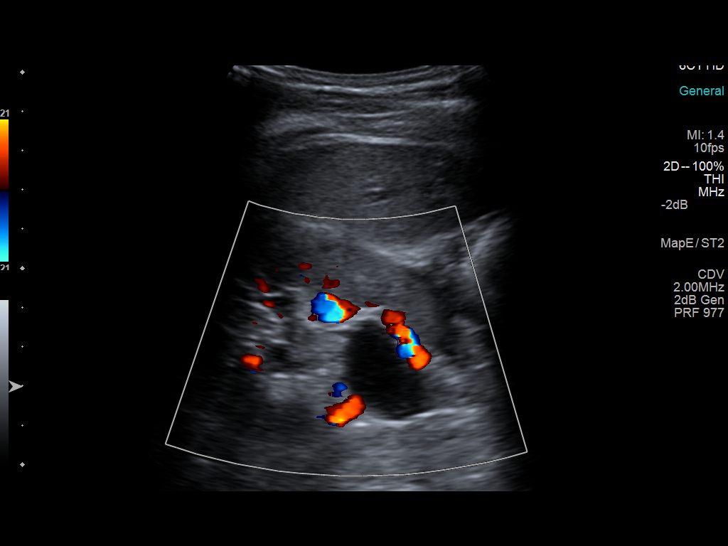
[im 20/54]
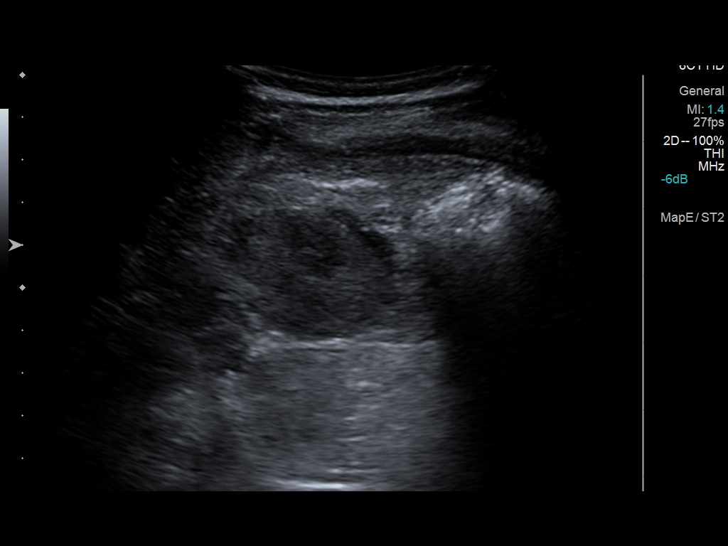
[im 25/54]
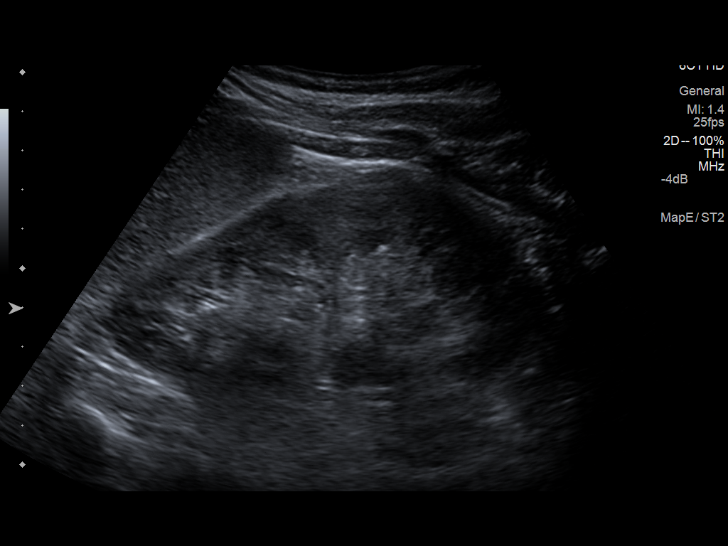
[im 29/54]
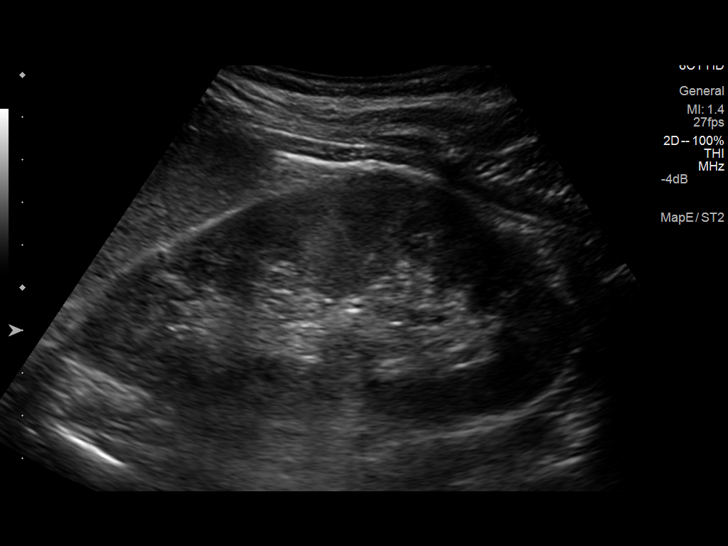
[im 34/54]
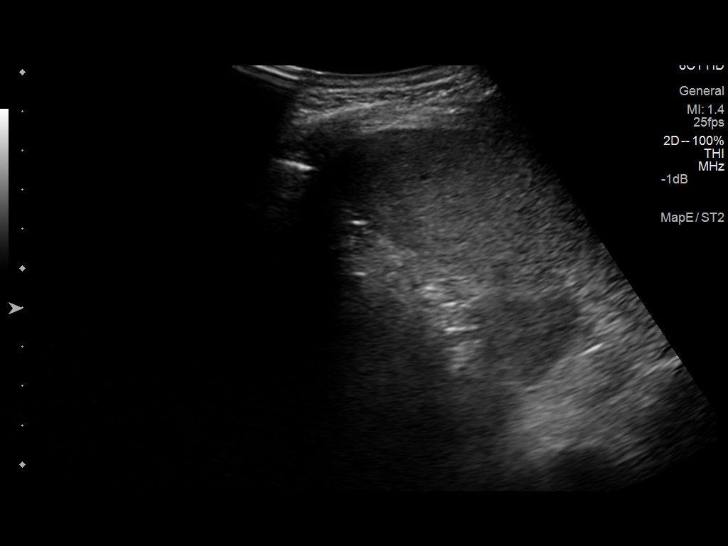
[im 36/54]
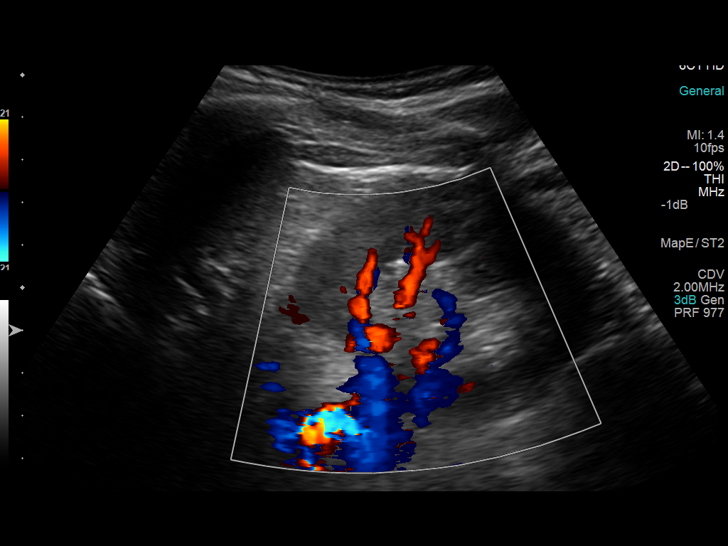
[im 40/54]
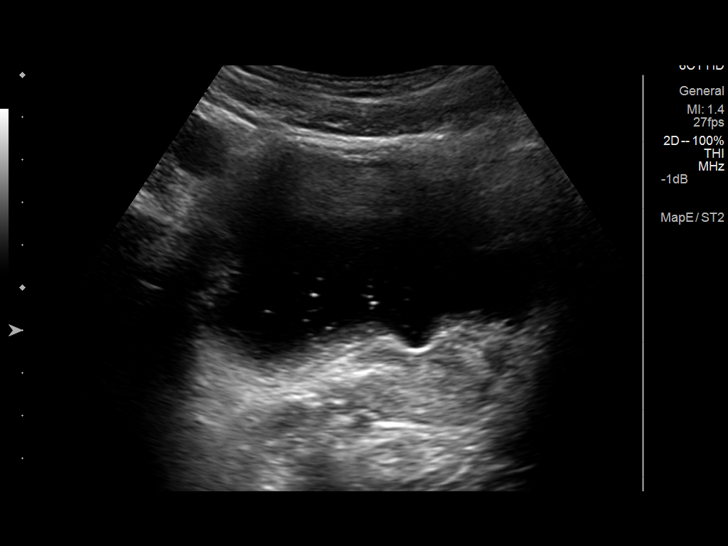
[im 45/54]
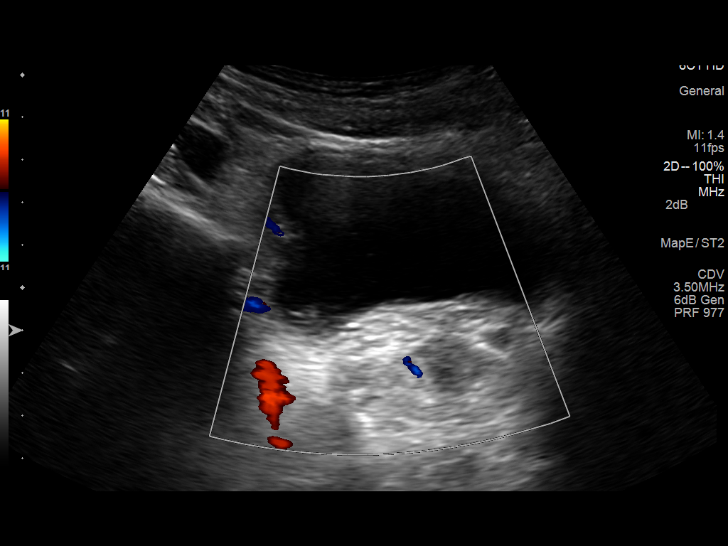
[im 49/54]
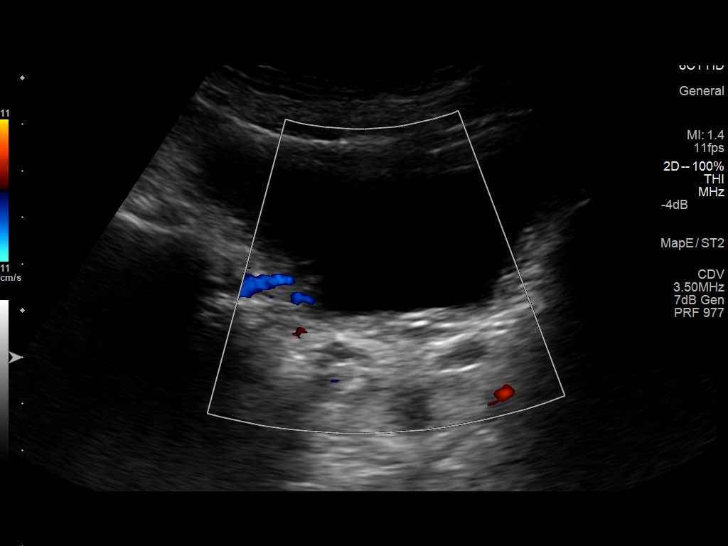
[im 54/54]
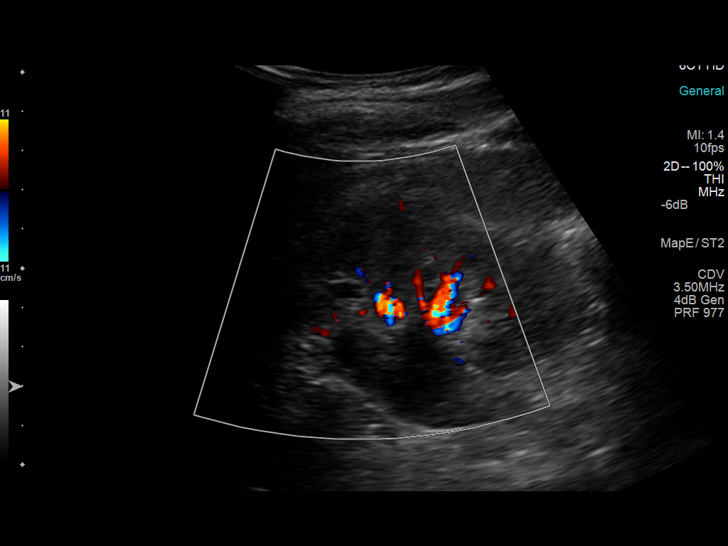

[14 of 25 positions shown; findings below may reference images not displayed]

FINDINGS: Right Kidney:

Length: 12.4 cm. There is moderate right-sided hydronephrosis. An
obstructing calculus is not identifiable by ultrasound. No renal
lesions are identified.

Left Kidney:

Length: 11.4 cm.. Echogenicity within normal limits. No mass or
hydronephrosis visualized.

Bladder:

Appears normal for degree of bladder distention.
IMPRESSION: Moderate right-sided hydronephrosis. The etiology of obstruction is
not identifiable by ultrasound.

## 2016-02-02 ENCOUNTER — Other Ambulatory Visit: Payer: Self-pay | Admitting: Neurology

## 2016-03-05 DIAGNOSIS — G40909 Epilepsy, unspecified, not intractable, without status epilepticus: Secondary | ICD-10-CM | POA: Diagnosis not present

## 2016-03-05 DIAGNOSIS — I10 Essential (primary) hypertension: Secondary | ICD-10-CM | POA: Diagnosis not present

## 2016-03-05 DIAGNOSIS — Z0000 Encounter for general adult medical examination without abnormal findings: Secondary | ICD-10-CM | POA: Diagnosis not present

## 2016-03-05 DIAGNOSIS — E785 Hyperlipidemia, unspecified: Secondary | ICD-10-CM | POA: Diagnosis not present

## 2016-03-14 ENCOUNTER — Ambulatory Visit (INDEPENDENT_AMBULATORY_CARE_PROVIDER_SITE_OTHER): Payer: BLUE CROSS/BLUE SHIELD | Admitting: Nurse Practitioner

## 2016-03-14 ENCOUNTER — Encounter: Payer: Self-pay | Admitting: Nurse Practitioner

## 2016-03-14 VITALS — BP 114/69 | HR 69 | Ht 67.0 in | Wt 149.4 lb

## 2016-03-14 DIAGNOSIS — R569 Unspecified convulsions: Secondary | ICD-10-CM | POA: Diagnosis not present

## 2016-03-14 DIAGNOSIS — G43109 Migraine with aura, not intractable, without status migrainosus: Secondary | ICD-10-CM

## 2016-03-14 MED ORDER — DIVALPROEX SODIUM ER 500 MG PO TB24: 1000.0000 mg | ORAL_TABLET | Freq: Every day | ORAL | Status: DC

## 2016-03-14 NOTE — Progress Notes (Signed)
[facility]  PATIENT: Jonathan SearingJohn P [name] DOB: 17-Sep-1966   REASON FOR VISIT follow-up for seizure disorder and migraines HISTORY FROM: Patient    HISTORY OF PRESENT ILLNESS: HISTORY: Peri JeffersonYYJohn P [name] is a 50 yo RH male,, is referred by emergency room, and his [facility], for evaluation of seizure In Feb 19th 2016, while in sleep, 2am, he was noticed by his wife having a seizure, grinding teeth, body stiffness, lasted for 3 minutes, followed by body limp, confusion afterward, he woke up in the ambulance, ED, he did bit his lip. He has no history of seizure, he complains of insomnia, recently started to take herb supplement. CT head wo in Feb 19th 2016 was normal. Lab, normal UA, CBC, BMP. He has occasional visual distortion at his right visual field, with associated headaches, movement made it worse. Intermittent, it can happen 2/day, or can happen every few month. He had similar episode in Feb 18th 2016 afternoon, the day before he had seizure, no headache, transient moment loss touch, his friend noticed that his face went blank.  UPDATE March 8th 2016:YY He is with his wife today, he complains of episodes of sudden onset confusions, difficulty talking, blurry vision, sometimes with flashing light, lasting for 1-2 minute. 2-3 times each week. His wife witness that he suddenly gets very quiet, still, confused, could not answer questions, last for 1-2 minutes. MRI of the brain was essentially normal, EEG showed generalized epileptiform discharges. I have started Keppra 500 mg twice a day December 29 2014 over the phone, he tolerates the medication well, but there was no significant change on the frequency of his confusion spells UPDATE Mar 16 2015:YYHe is doing well, tolerating Depakote ER 500mg  2 tabs po qhs,he can sleep better, he has not had visual distortion, no confusion episode,no seizure,  UPDATE 09/19/2015 Jonathan [name],  50 year old returns to follow-up for seizure disorder and migraines. He has been followed in the office previously by Dr. Terrace ArabiaYan who last saw him 03/16/2015. He is currently on Depakote ER 500 mg 2 tabs at bedtime. He had a mildly low platelet count at his last visit which will be repeated today. He denies any recent headaches. Last seizure activity 12/22/2014. He returned to driving in August. He has no new complaints UPDATE 05/11/2017CM Jonathan [name], 50 year old male returns for follow-up. He has a history of seizure disorder and migraines and is currently on Depakote ER 502 tablets at bedtime last seizure event occurred 12/22/2014. He had recent labs at his primary care and was told they were within normal limits. He has had mildly low platelet count in the past. He needs refills on his medications. He returns for reevaluation  REVIEW OF SYSTEMS: Full 14 system review of systems performed and notable only for those listed, all others are neg:  Constitutional: neg  Cardiovascular: neg Ear/Nose/Throat: neg  Skin: neg Eyes: neg Respiratory: neg Gastroitestinal: neg  Hematology/Lymphatic: neg  Endocrine: neg Musculoskeletal:neg Allergy/Immunology: neg Neurological: neg Psychiatric: neg Sleep : neg   ALLERGIES: Allergies  Allergen Reactions  . Bee Venom     unknown    HOME MEDICATIONS: Outpatient Prescriptions Prior to Visit  Medication Sig Dispense Refill  . divalproex (DEPAKOTE ER) 500 MG 24 hr tablet Take 2 tablets (1,000 mg total) by mouth daily. 180 tablet 3  . lisinopril-hydrochlorothiazide (PRINZIDE,ZESTORETIC) 20-25 MG tablet Take 1 tablet by mouth daily.   10  . divalproex (DEPAKOTE ER) 500 MG 24 hr tablet TAKE 2  TABLETS BY MOUTH DAILY 60 tablet 11   No facility-administered medications prior to visit.    PAST MEDICAL HISTORY: Past Medical History  Diagnosis Date  . HTN (hypertension)   . Seizure (HCC)   . Bee sting allergy     PAST SURGICAL HISTORY: Past Surgical  History  Procedure Laterality Date  . No past surgeries      FAMILY HISTORY: Family History  Problem Relation Age of Onset  . Cancer Maternal Grandmother     breast  . Hypertension Mother   . Heart attack Father     SOCIAL HISTORY: Social History   Social History  . Marital Status: Married    Spouse Name: [name]  . Number of Children: 1  . Years of Education: 16   Occupational History  . [employer]     Self-employed   Social History Main Topics  . Smoking status: Never Smoker   . Smokeless tobacco: Not on file  . Alcohol Use: 0.0 oz/week    0 Standard drinks or equivalent per week     Comment: 5-6 drinks/week  . Drug Use: No  . Sexual Activity: Not on file   Other Topics Concern  . Not on file   Social History Narrative   Right-handed.   Lives at home with her wife.   2 cups caffeine/day.   One child.     PHYSICAL EXAM  Filed Vitals:   03/14/16 0940  BP: 114/69  Pulse: 69  Height:  (1.702 m)  Weight: 149 lb 6.4 oz (67.767 kg)   Body mass index is 23.39 kg/(m^2). Generalized: Well developed, in no acute distress  Head: normocephalic and atraumatic,. Oropharynx benign  Neck: Supple, no carotid bruits  Cardiac: Regular rate rhythm, no murmur  Musculoskeletal: No deformity   Neurological examination   Mentation: Alert oriented to time, place, history taking. Attention span and concentration appropriate. Recent and remote memory intact. Follows all commands speech and language fluent.   Cranial nerve II-XII: Pupils were equal round reactive to light extraocular movements were full, visual field were full on confrontational test. Facial sensation and strength were normal. hearing was intact to finger rubbing bilaterally. Uvula tongue midline. head turning and shoulder shrug were normal and symmetric.Tongue protrusion into cheek strength was normal. Motor: normal bulk and tone, full strength in the BUE, BLE, fine finger movements  normal, no pronator drift. No focal weakness Sensory: normal and symmetric to light touch, pinprick, and Vibration,  Coordination: finger-nose-finger, heel-to-shin bilaterally, no dysmetria Reflexes: Brachioradialis 2/2, biceps 2/2, triceps 2/2, patellar 2/2, Achilles 2/2, plantar responses were flexor bilaterally. Gait and Station: Rising up from seated position without assistance, normal stance, moderate stride, good arm swing, smooth turning, able to perform tiptoe, and heel walking without difficulty. Tandem gait is steady  DIAGNOSTIC DATA (LABS, IMAGING, TESTING)   ASSESSMENT AND PLAN  50 y.o. year old male  has a past medical history of HTN (hypertension); Seizure (HCC); and migraine headaches here to follow-up. Last seizure event February 2016. Migraines in good control.  PLAN; Continue Depakote at current dose , this is for seizure disorder and migraine prevention will refill for 1 year Call for any seizures or increase in headaches Labs were just done at primary care and within normal limits according to the patient F/U yearly and prn Nilda Riggs, [facility], [facility], APRN  [facility] [address], [address] [place], [place] [address] [phone]

## 2016-03-14 NOTE — Patient Instructions (Signed)
Continue Depakote at current dose , this is for seizure disorder and migraine prevention will refill for 1 year Call for any seizures or increase in headaches F/U yearly and prn

## 2016-05-24 DIAGNOSIS — M5135 Other intervertebral disc degeneration, thoracolumbar region: Secondary | ICD-10-CM | POA: Diagnosis not present

## 2016-05-24 DIAGNOSIS — M9905 Segmental and somatic dysfunction of pelvic region: Secondary | ICD-10-CM | POA: Diagnosis not present

## 2016-05-24 DIAGNOSIS — M5136 Other intervertebral disc degeneration, lumbar region: Secondary | ICD-10-CM | POA: Diagnosis not present

## 2016-06-05 DIAGNOSIS — M5135 Other intervertebral disc degeneration, thoracolumbar region: Secondary | ICD-10-CM | POA: Diagnosis not present

## 2016-06-05 DIAGNOSIS — M9905 Segmental and somatic dysfunction of pelvic region: Secondary | ICD-10-CM | POA: Diagnosis not present

## 2016-06-05 DIAGNOSIS — M9903 Segmental and somatic dysfunction of lumbar region: Secondary | ICD-10-CM | POA: Diagnosis not present

## 2016-06-05 DIAGNOSIS — M5136 Other intervertebral disc degeneration, lumbar region: Secondary | ICD-10-CM | POA: Diagnosis not present

## 2016-06-12 DIAGNOSIS — M9905 Segmental and somatic dysfunction of pelvic region: Secondary | ICD-10-CM | POA: Diagnosis not present

## 2016-06-12 DIAGNOSIS — M5135 Other intervertebral disc degeneration, thoracolumbar region: Secondary | ICD-10-CM | POA: Diagnosis not present

## 2016-06-12 DIAGNOSIS — M5136 Other intervertebral disc degeneration, lumbar region: Secondary | ICD-10-CM | POA: Diagnosis not present

## 2016-06-12 DIAGNOSIS — M9903 Segmental and somatic dysfunction of lumbar region: Secondary | ICD-10-CM | POA: Diagnosis not present

## 2016-06-14 DIAGNOSIS — M5135 Other intervertebral disc degeneration, thoracolumbar region: Secondary | ICD-10-CM | POA: Diagnosis not present

## 2016-06-14 DIAGNOSIS — M5136 Other intervertebral disc degeneration, lumbar region: Secondary | ICD-10-CM | POA: Diagnosis not present

## 2016-06-14 DIAGNOSIS — M9905 Segmental and somatic dysfunction of pelvic region: Secondary | ICD-10-CM | POA: Diagnosis not present

## 2016-06-19 DIAGNOSIS — M9903 Segmental and somatic dysfunction of lumbar region: Secondary | ICD-10-CM | POA: Diagnosis not present

## 2016-06-19 DIAGNOSIS — M5136 Other intervertebral disc degeneration, lumbar region: Secondary | ICD-10-CM | POA: Diagnosis not present

## 2016-06-19 DIAGNOSIS — M5135 Other intervertebral disc degeneration, thoracolumbar region: Secondary | ICD-10-CM | POA: Diagnosis not present

## 2016-06-19 DIAGNOSIS — M9905 Segmental and somatic dysfunction of pelvic region: Secondary | ICD-10-CM | POA: Diagnosis not present

## 2016-06-21 DIAGNOSIS — M5136 Other intervertebral disc degeneration, lumbar region: Secondary | ICD-10-CM | POA: Diagnosis not present

## 2016-06-21 DIAGNOSIS — M9903 Segmental and somatic dysfunction of lumbar region: Secondary | ICD-10-CM | POA: Diagnosis not present

## 2016-06-21 DIAGNOSIS — M9905 Segmental and somatic dysfunction of pelvic region: Secondary | ICD-10-CM | POA: Diagnosis not present

## 2016-06-26 DIAGNOSIS — M5135 Other intervertebral disc degeneration, thoracolumbar region: Secondary | ICD-10-CM | POA: Diagnosis not present

## 2016-06-26 DIAGNOSIS — M9903 Segmental and somatic dysfunction of lumbar region: Secondary | ICD-10-CM | POA: Diagnosis not present

## 2016-06-26 DIAGNOSIS — M5136 Other intervertebral disc degeneration, lumbar region: Secondary | ICD-10-CM | POA: Diagnosis not present

## 2016-07-11 DIAGNOSIS — M5136 Other intervertebral disc degeneration, lumbar region: Secondary | ICD-10-CM | POA: Diagnosis not present

## 2016-07-11 DIAGNOSIS — M9903 Segmental and somatic dysfunction of lumbar region: Secondary | ICD-10-CM | POA: Diagnosis not present

## 2016-07-11 DIAGNOSIS — M9905 Segmental and somatic dysfunction of pelvic region: Secondary | ICD-10-CM | POA: Diagnosis not present

## 2016-07-11 DIAGNOSIS — M5135 Other intervertebral disc degeneration, thoracolumbar region: Secondary | ICD-10-CM | POA: Diagnosis not present

## 2016-07-25 DIAGNOSIS — M5136 Other intervertebral disc degeneration, lumbar region: Secondary | ICD-10-CM | POA: Diagnosis not present

## 2016-07-25 DIAGNOSIS — M5135 Other intervertebral disc degeneration, thoracolumbar region: Secondary | ICD-10-CM | POA: Diagnosis not present

## 2016-07-25 DIAGNOSIS — M9905 Segmental and somatic dysfunction of pelvic region: Secondary | ICD-10-CM | POA: Diagnosis not present

## 2016-07-25 DIAGNOSIS — M9903 Segmental and somatic dysfunction of lumbar region: Secondary | ICD-10-CM | POA: Diagnosis not present

## 2016-08-01 DIAGNOSIS — M9903 Segmental and somatic dysfunction of lumbar region: Secondary | ICD-10-CM | POA: Diagnosis not present

## 2016-08-01 DIAGNOSIS — M5136 Other intervertebral disc degeneration, lumbar region: Secondary | ICD-10-CM | POA: Diagnosis not present

## 2016-08-01 DIAGNOSIS — M5135 Other intervertebral disc degeneration, thoracolumbar region: Secondary | ICD-10-CM | POA: Diagnosis not present

## 2016-08-01 DIAGNOSIS — M9905 Segmental and somatic dysfunction of pelvic region: Secondary | ICD-10-CM | POA: Diagnosis not present

## 2016-08-08 DIAGNOSIS — M9903 Segmental and somatic dysfunction of lumbar region: Secondary | ICD-10-CM | POA: Diagnosis not present

## 2016-08-08 DIAGNOSIS — M5136 Other intervertebral disc degeneration, lumbar region: Secondary | ICD-10-CM | POA: Diagnosis not present

## 2016-08-08 DIAGNOSIS — M9905 Segmental and somatic dysfunction of pelvic region: Secondary | ICD-10-CM | POA: Diagnosis not present

## 2016-08-08 DIAGNOSIS — M5135 Other intervertebral disc degeneration, thoracolumbar region: Secondary | ICD-10-CM | POA: Diagnosis not present

## 2016-09-01 DIAGNOSIS — Z23 Encounter for immunization: Secondary | ICD-10-CM | POA: Diagnosis not present

## 2016-09-18 IMAGING — CR DG CHEST 2V
1 series · 1 of 1 positions shown · non-contrast
Comparison: None.

CLINICAL DATA: Seizure

EXAM:
CHEST  2 VIEW

[x chest ap]
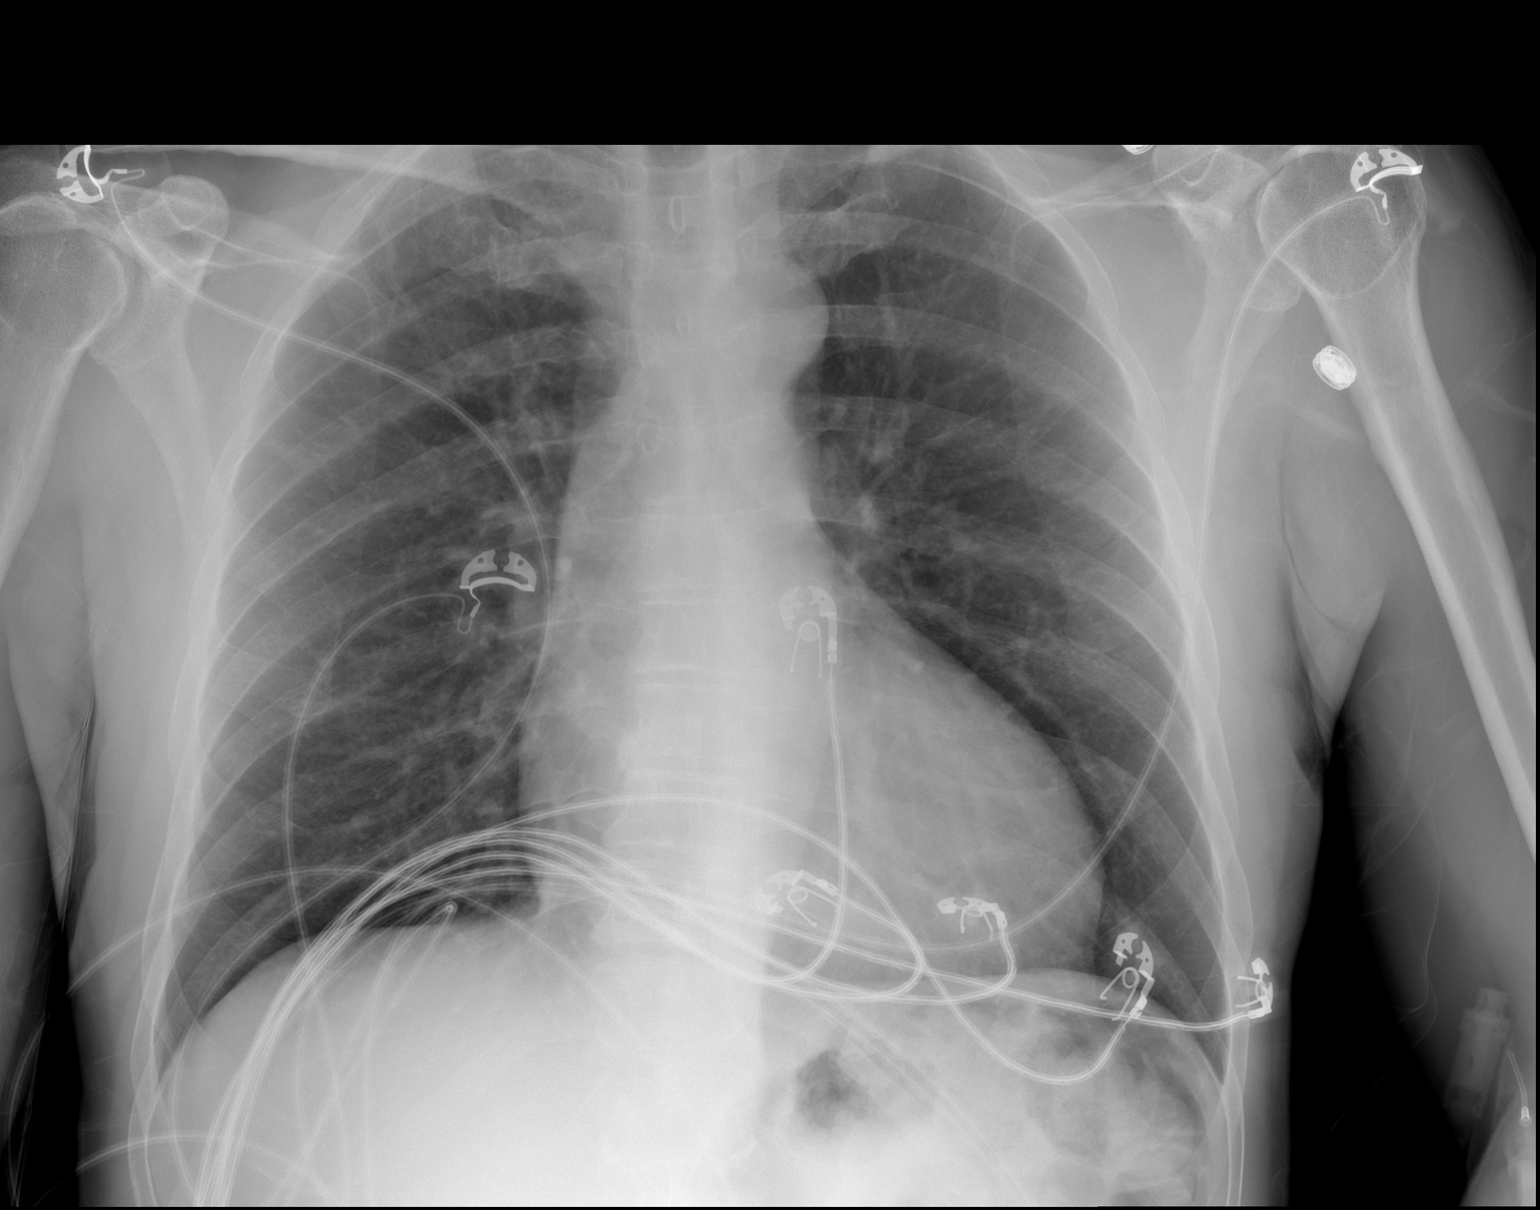

[1 of 1 positions shown; findings below may reference images not displayed]

FINDINGS: A single AP portable view of the chest demonstrates no focal
airspace consolidation or alveolar edema. The lungs are grossly
clear. There is no large effusion or pneumothorax. Cardiac and
mediastinal contours appear unremarkable.
IMPRESSION: No active cardiopulmonary disease.

## 2016-10-26 ENCOUNTER — Emergency Department (HOSPITAL_COMMUNITY)
Admission: EM | Admit: 2016-10-26 | Discharge: 2016-10-27 | Disposition: A | Discharge status: Home / Self Care | Payer: BLUE CROSS/BLUE SHIELD | Attending: Emergency Medicine | Admitting: Emergency Medicine

## 2016-10-26 ENCOUNTER — Other Ambulatory Visit: Payer: Self-pay

## 2016-10-26 DIAGNOSIS — Z7982 Long term (current) use of aspirin: Secondary | ICD-10-CM | POA: Diagnosis not present

## 2016-10-26 DIAGNOSIS — G40909 Epilepsy, unspecified, not intractable, without status epilepticus: Secondary | ICD-10-CM | POA: Insufficient documentation

## 2016-10-26 DIAGNOSIS — T426X6A Underdosing of other antiepileptic and sedative-hypnotic drugs, initial encounter: Secondary | ICD-10-CM | POA: Insufficient documentation

## 2016-10-26 DIAGNOSIS — Z91138 Patient's unintentional underdosing of medication regimen for other reason: Secondary | ICD-10-CM | POA: Diagnosis not present

## 2016-10-26 DIAGNOSIS — Z79899 Other long term (current) drug therapy: Secondary | ICD-10-CM | POA: Insufficient documentation

## 2016-10-26 DIAGNOSIS — I10 Essential (primary) hypertension: Secondary | ICD-10-CM | POA: Diagnosis not present

## 2016-10-26 DIAGNOSIS — R569 Unspecified convulsions: Secondary | ICD-10-CM

## 2016-10-26 LAB — CBC WITH DIFFERENTIAL/PLATELET
Basophils Absolute: 0 10*3/uL (ref 0.0–0.1)
Basophils Relative: 1 %
Eosinophils Absolute: 0 10*3/uL (ref 0.0–0.7)
Eosinophils Relative: 0 %
HCT: 35.9 % — ABNORMAL LOW (ref 39.0–52.0)
Hemoglobin: 12.7 g/dL — ABNORMAL LOW (ref 13.0–17.0)
Lymphocytes Relative: 18 %
Lymphs Abs: 1.2 10*3/uL (ref 0.7–4.0)
MCH: 32.2 pg (ref 26.0–34.0)
MCHC: 35.4 g/dL (ref 30.0–36.0)
MCV: 90.9 fL (ref 78.0–100.0)
Monocytes Absolute: 0.8 10*3/uL (ref 0.1–1.0)
Monocytes Relative: 12 %
Neutro Abs: 4.6 10*3/uL (ref 1.7–7.7)
Neutrophils Relative %: 69 %
Platelets: 147 10*3/uL — ABNORMAL LOW (ref 150–400)
RBC: 3.95 MIL/uL — ABNORMAL LOW (ref 4.22–5.81)
RDW: 12.2 % (ref 11.5–15.5)
WBC: 6.6 10*3/uL (ref 4.0–10.5)

## 2016-10-26 LAB — BASIC METABOLIC PANEL
Anion gap: 8 (ref 5–15)
BUN: 16 mg/dL (ref 6–20)
CO2: 23 mmol/L (ref 22–32)
Calcium: 8.8 mg/dL — ABNORMAL LOW (ref 8.9–10.3)
Chloride: 98 mmol/L — ABNORMAL LOW (ref 101–111)
Creatinine, Ser: 1.03 mg/dL (ref 0.61–1.24)
GFR calc Af Amer: >60 mL/min (ref >60)
GFR calc non Af Amer: >60 mL/min (ref >60)
Glucose, Bld: 106 mg/dL — ABNORMAL HIGH (ref 65–99)
Potassium: 3.2 mmol/L — ABNORMAL LOW (ref 3.5–5.1)
Sodium: 129 mmol/L — ABNORMAL LOW (ref 135–145)

## 2016-10-26 MED ORDER — VALPROATE SODIUM 500 MG/5ML IV SOLN
1500.0000 mg | Freq: Once | INTRAVENOUS | Status: AC
  Administered 2016-10-26: 1500 mg via INTRAVENOUS
  Filled 2016-10-26: qty 15

## 2016-10-26 NOTE — ED Triage Notes (Signed)
Per [facility], Pt coming from home. No falls or trauma. Hx pod epilespisy. Last seizure 2 yrs ago. 1930 ending after 5 minutes. Postictal for 30 min while on scene. Pt has not had his medication for the last 3 days.A&O x4 now.

## 2016-10-26 NOTE — ED Provider Notes (Signed)
[facility]-EMERGENCY DEPT Provider Note   CSN: [id number] Arrival date & time: 10/26/16  2053     History   Chief Complaint Chief Complaint  Patient presents with  . Seizures    HPI Jonathan [name] is a 50 y.o. male.  Jonathan [name] is a 50 y.o. Male with a history of seizures who presents to the emergency department via EMS after a seizure prior to arrival today. Patient's spouse reports that she found him laying in bed moaning with teeth gritting and some light body shaking. She reports this is similar to the seizure he had 2 years ago. She reports this lasted for a few minutes then he had a period where he was postictal. The patient tells me that he typically takes thousand grams of Depakote ER nightly. He tells me he is not taking Depakote and at least 4 days and if not a week. He actually forgot to pick up his refill for his prescription. He has not had a seizure since he was started on Depakote. He is followed by [facility]. He tells me that tonight he checked his temperature because he felt he was feverish. He noted it to be around 101 at home tonight. He took ibuprofen did not want to mix ibuprofen with Depakote. He tells me he does have plenty of Depakote at home now as he went to pick it up at the pharmacy. Patient denies recent illness. Patient denies cough, wheezing, shortness of breath, chest pain, headache, neck stiffness, sore throat, nasal congestion, abdominal pain, nausea, vomiting, diarrhea or urinary symptoms.   The history is provided by the patient, medical records and the spouse. No language interpreter was used.  Seizures   Pertinent negatives include no headaches, no visual disturbance, no sore throat, no chest pain, no cough, no nausea, no vomiting and no diarrhea.    Past Medical History:  Diagnosis Date  . Bee sting allergy   . HTN (hypertension)   . Seizure [facility])     Patient Active Problem List   Diagnosis Date Noted  . Seizures (HCC)  12/28/2014  . Migraine with aura and without status migrainosus, not intractable 12/28/2014  . Right inguinal hernia 06/24/2014    Past Surgical History:  Procedure Laterality Date  . NO PAST SURGERIES         Home Medications    Prior to Admission medications   Medication Sig Start Date End Date Taking? Authorizing Provider  aspirin-acetaminophen-caffeine (EXCEDRIN MIGRAINE) [phone] MG tablet Take 1 tablet by mouth every 6 (six) hours as needed for headache.   Yes Historical Provider, MD  divalproex (DEPAKOTE ER) 500 MG 24 hr tablet Take 2 tablets (1,000 mg total) by mouth daily. 03/14/16  Yes Nilda Riggs, NP  ibuprofen (ADVIL,MOTRIN) 200 MG tablet Take 400 mg by mouth every 6 (six) hours as needed for moderate pain.   Yes Historical Provider, MD  lisinopril-hydrochlorothiazide (PRINZIDE,ZESTORETIC) 20-25 MG tablet Take 1 tablet by mouth daily.  09/14/15  Yes Historical Provider, MD    Family History Family History  Problem Relation Age of Onset  . Cancer Maternal Grandmother     breast  . Hypertension Mother   . Heart attack Father     Social History Social History  Substance Use Topics  . Smoking status: Never Smoker  . Smokeless tobacco: Not on file  . Alcohol use 0.0 oz/week     Comment: 5-6 drinks/week     Allergies   Bee venom   Review  of Systems Review of Systems  Constitutional: Positive for fever. Negative for chills.  HENT: Negative for congestion and sore throat.   Eyes: Negative for visual disturbance.  Respiratory: Negative for cough, shortness of breath and wheezing.   Cardiovascular: Negative for chest pain and palpitations.  Gastrointestinal: Negative for abdominal pain, diarrhea, nausea and vomiting.  Genitourinary: Negative for dysuria.  Musculoskeletal: Negative for back pain, neck pain and neck stiffness.  Skin: Negative for rash.  Neurological: Positive for seizures. Negative for dizziness, weakness, light-headedness and  headaches.     Physical Exam Updated Vital Signs BP 119/82 (BP Location: Right Arm)   Pulse 71   Temp 98.1 F (36.7 C) (Oral)   Resp 17   SpO2 96%   Physical Exam  Constitutional: He is oriented to person, place, and time. He appears well-developed and well-nourished. No distress.  Nontoxic appearing.  HENT:  Head: Normocephalic and atraumatic.  Right Ear: External ear normal.  Left Ear: External ear normal.  Mouth/Throat: Oropharynx is clear and moist.  No visible evidence of head trauma. Bilateral tympanic membranes are pearly-gray without erythema or loss of landmarks.   Eyes: Conjunctivae and EOM are normal. Pupils are equal, round, and reactive to light. Right eye exhibits no discharge. Left eye exhibits no discharge.  Neck: Normal range of motion. Neck supple. No JVD present.  Cardiovascular: Normal rate, regular rhythm, normal heart sounds and intact distal pulses.  Exam reveals no gallop and no friction rub.   No murmur heard. Pulmonary/Chest: Effort normal and breath sounds normal. No stridor. No respiratory distress. He has no wheezes. He has no rales.  Abdominal: Soft. There is no tenderness. There is no guarding.  Musculoskeletal: He exhibits no edema.  Lymphadenopathy:    He has no cervical adenopathy.  Neurological: He is alert and oriented to person, place, and time. No cranial nerve deficit. Coordination normal.  Patient is alert and oriented 3. Cranial nerves are intact. Speech is clear and coherent. No pronator drift. Finger-to-nose intact bilaterally. EOMs are intact. Vision is grossly intact.  Skin: Skin is warm and dry. Capillary refill takes less than 2 seconds. No rash noted. He is not diaphoretic. No erythema. No pallor.  Psychiatric: He has a normal mood and affect. His behavior is normal.  Nursing note and vitals reviewed.    ED Treatments / Results  Labs (all labs ordered are listed, but only abnormal results are displayed) Labs Reviewed  BASIC  METABOLIC PANEL - Abnormal; Notable for the following:       Result Value   Sodium 129 (*)    Potassium 3.2 (*)    Chloride 98 (*)    Glucose, Bld 106 (*)    Calcium 8.8 (*)    All other components within normal limits  CBC WITH DIFFERENTIAL/PLATELET - Abnormal; Notable for the following:    RBC 3.95 (*)    Hemoglobin 12.7 (*)    HCT 35.9 (*)    Platelets 147 (*)    All other components within normal limits  VALPROIC ACID LEVEL - Abnormal; Notable for the following:    Valproic Acid Lvl <10 (*)    All other components within normal limits    EKG  EKG Interpretation None       Radiology No results found.  Procedures Procedures (including critical care time)  Medications Ordered in ED Medications  valproate (DEPACON) 1,500 mg in dextrose 5 % 50 mL IVPB (0 mg Intravenous Stopped 10/27/16 0018)  sodium chloride 0.9 %  bolus 1,000 mL (1,000 mLs Intravenous New Bag/Given 10/27/16 0033)     Initial Impression / Assessment and Plan / ED Course  I have reviewed the triage vital signs and the nursing notes.  Pertinent labs & imaging results that were available during my care of the patient were reviewed by me and considered in my medical decision making (see chart for details).  Clinical Course    This  is a 50 y.o. Male with a history of seizures who presents to the emergency department via EMS after a seizure prior to arrival today. Patient's spouse reports that she found him laying in bed moaning with teeth gritting and some light body shaking. She reports this is similar to the seizure he had 2 years ago. She reports this lasted for a few minutes then he had a period where he was postictal. The patient tells me that he typically takes thousand grams of Depakote ER nightly. He tells me he is not taking Depakote and at least 4 days and if not a week. He actually forgot to pick up his refill for his prescription. He has not had a seizure since he was started on Depakote. He is  [facility]. He tells me that tonight he checked his temperature because he felt he was feverish. He noted it to be around 101 at home tonight. He took ibuprofen did not want to mix ibuprofen with Depakote. He tells me he does have plenty of Depakote at home now as he went to pick it up at the pharmacy. On exam the patient is afebrile nontoxic appearing. He has no focal neurological deficits. Speech is clear and coherent. Lungs clear to auscultation bilaterally. Abdomen is soft and nontender. Patient without fevers with repeat check. BMP is marked for a sodium of 129. CBC shows normal white count. Valproic acid level was undetectable. I spoke with pharmacy and they recommended 1500 mg loading dose of Depakote. Patient was loaded with Depakote and provide with a fluid bolus. During his emergency room stay patient had no further seizures. No fevers either. He reports feeling better. I suspect patient had a seizure due to not taking his medication for at least 4 days. I advised to have him restart his Depakote as usual tonight (12/24). I encouraged him to follow-up with neurologist. I advised the patient to follow-up with their primary care provider this week. I advised the patient to return to the emergency department with new or worsening symptoms or new concerns. The patient verbalized understanding and agreement with plan.    This patient was discussed with Dr. Eudelia Bunchardama who agrees with assessment and plan.   Final Clinical Impressions(s) / ED Diagnoses   Final diagnoses:  Seizure [facility](HCC)    New Prescriptions New Prescriptions   No medications on file     Everlene FarrierWilliam Marlo Arriola, PA-C 10/27/16 0056    Nira ConnPedro Eduardo Cardama, MD 10/27/16 [phone]

## 2016-10-27 LAB — VALPROIC ACID LEVEL: Valproic Acid Lvl: <10 ug/mL — ABNORMAL LOW (ref 50.0–100.0)

## 2016-10-27 MED ORDER — SODIUM CHLORIDE 0.9 % IV BOLUS (SEPSIS)
1000.0000 mL | Freq: Once | INTRAVENOUS | Status: AC
Start: 2016-10-27 — End: 2016-10-27
  Administered 2016-10-27: 1000 mL via INTRAVENOUS

## 2016-10-27 NOTE — ED Notes (Signed)
Patient Alert and oriented X4. Stable and ambulatory. Patient verbalized understanding of the discharge instructions.  Patient belongings were taken by the patient.  

## 2016-10-27 NOTE — Discharge Instructions (Signed)
Please start taking your Depakote tonight (12/24) like usual.

## 2016-10-31 DIAGNOSIS — M5412 Radiculopathy, cervical region: Secondary | ICD-10-CM | POA: Diagnosis not present

## 2016-11-05 DIAGNOSIS — M5412 Radiculopathy, cervical region: Secondary | ICD-10-CM | POA: Diagnosis not present

## 2016-11-07 DIAGNOSIS — M5412 Radiculopathy, cervical region: Secondary | ICD-10-CM | POA: Diagnosis not present

## 2016-11-12 DIAGNOSIS — M5412 Radiculopathy, cervical region: Secondary | ICD-10-CM | POA: Diagnosis not present

## 2016-11-14 ENCOUNTER — Ambulatory Visit
Admission: RE | Admit: 2016-11-14 | Discharge: 2016-11-14 | Disposition: A | Discharge status: Home / Self Care | Payer: BLUE CROSS/BLUE SHIELD | Source: Ambulatory Visit | Attending: Family Medicine | Admitting: Family Medicine

## 2016-11-14 ENCOUNTER — Other Ambulatory Visit: Payer: Self-pay | Admitting: Family Medicine

## 2016-11-14 DIAGNOSIS — R509 Fever, unspecified: Secondary | ICD-10-CM

## 2016-11-14 DIAGNOSIS — R05 Cough: Secondary | ICD-10-CM

## 2016-11-14 DIAGNOSIS — J209 Acute bronchitis, unspecified: Secondary | ICD-10-CM | POA: Diagnosis not present

## 2016-11-14 DIAGNOSIS — R059 Cough, unspecified: Secondary | ICD-10-CM

## 2016-11-19 DIAGNOSIS — M5412 Radiculopathy, cervical region: Secondary | ICD-10-CM | POA: Diagnosis not present

## 2017-03-11 DIAGNOSIS — I10 Essential (primary) hypertension: Secondary | ICD-10-CM | POA: Diagnosis not present

## 2017-03-11 DIAGNOSIS — E785 Hyperlipidemia, unspecified: Secondary | ICD-10-CM | POA: Diagnosis not present

## 2017-03-11 DIAGNOSIS — Z0000 Encounter for general adult medical examination without abnormal findings: Secondary | ICD-10-CM | POA: Diagnosis not present

## 2017-03-18 ENCOUNTER — Encounter: Payer: Self-pay | Admitting: Nurse Practitioner

## 2017-03-18 ENCOUNTER — Ambulatory Visit (INDEPENDENT_AMBULATORY_CARE_PROVIDER_SITE_OTHER): Payer: BLUE CROSS/BLUE SHIELD | Admitting: Nurse Practitioner

## 2017-03-18 ENCOUNTER — Encounter (INDEPENDENT_AMBULATORY_CARE_PROVIDER_SITE_OTHER): Payer: Self-pay

## 2017-03-18 VITALS — BP 106/64 | HR 53 | Ht 67.0 in | Wt 151.8 lb

## 2017-03-18 DIAGNOSIS — Z5181 Encounter for therapeutic drug level monitoring: Secondary | ICD-10-CM | POA: Insufficient documentation

## 2017-03-18 DIAGNOSIS — R569 Unspecified convulsions: Secondary | ICD-10-CM

## 2017-03-18 DIAGNOSIS — G43109 Migraine with aura, not intractable, without status migrainosus: Secondary | ICD-10-CM

## 2017-03-18 NOTE — Progress Notes (Signed)
[facility]  PATIENT: Jonathan [name] DOB: 1965-11-05   REASON FOR VISIT follow-up for seizure disorder and migraines HISTORY FROM: Patient    HISTORY OF PRESENT ILLNESS: HISTORY: Jonathan [name] is a 51 yo RH male,, is referred by emergency room, and his primary care physician from [facility] family practice, for evaluation of seizure In Feb 19th 2016, while in sleep, 2am, he was noticed by his wife having a seizure, grinding teeth, body stiffness, lasted for 3 minutes, followed by body limp, confusion afterward, he woke up in the ambulance, ED, he did bit his lip. He has no history of seizure, he complains of insomnia, recently started to take herb supplement. CT head wo in Feb 19th 2016 was normal. Lab, normal UA, CBC, BMP. He has occasional visual distortion at his right visual field, with associated headaches, movement made it worse. Intermittent, it can happen 2/day, or can happen every few month. He had similar episode in Feb 18th 2016 afternoon, the day before he had seizure, no headache, transient moment loss touch, his friend noticed that his face went blank.  UPDATE March 8th 2016:YY He is with his wife today, he complains of episodes of sudden onset confusions, difficulty talking, blurry vision, sometimes with flashing light, lasting for 1-2 minute. 2-3 times each week. His wife witness that he suddenly gets very quiet, still, confused, could not answer questions, last for 1-2 minutes. MRI of the brain was essentially normal, EEG showed generalized epileptiform discharges. I have started Keppra 500 mg twice a day December 29 2014 over the phone, he tolerates the medication well, but there was no significant change on the frequency of his confusion spells UPDATE 09/19/2015 Jonathan [name], 51 year old returns to follow-up for seizure disorder and migraines. He has been followed in the office previously by Dr. Terrace Arabia who last saw him 03/16/2015. He is currently on  Depakote ER 500 mg 2 tabs at bedtime. He had a mildly low platelet count at his last visit which will be repeated today. He denies any recent headaches. Last seizure activity 12/22/2014. He returned to driving in August. He has no new complaints UPDATE 05/11/2017CM Jonathan [name], 51 year old male returns for follow-up. He has a history of seizure disorder and migraines and is currently on Depakote ER 500 2 tablets at bedtime last seizure event occurred 12/22/2014. He had recent labs at his primary care and was told they were within normal limits. He has had mildly low platelet count in the past. He needs refills on his medications. He returns for reevaluation UPDATE 05/15/2018CM Jonathan [name], 51 year old male returns for follow-up with history of seizure disorder and migraine headaches. She was seen in the emergency room on 10/26/2016 after a witnessed seizure by his wife. He had been off of his Depakote for it at least 4 days and probably a week. Valproic acid level that day was less than 10. He returns for reevaluation today claiming  that he has been compliant with his Depakote and had further seizure activity since December 2017. He denies side effects to the Depakote. He also reports no migraine headaches. He returns for reevaluation REVIEW OF SYSTEMS: Full 14 system review of systems performed and notable only for those listed, all others are neg:  Constitutional: neg  Cardiovascular: neg Ear/Nose/Throat: neg  Skin: neg Eyes: neg Respiratory: neg Gastroitestinal: neg  Hematology/Lymphatic: neg  Endocrine: neg Musculoskeletal:neg Allergy/Immunology: neg Neurological: Seizure on 10/26/2016 Psychiatric: neg Sleep : neg   ALLERGIES: Allergies  Allergen Reactions  . Bee  Venom     unknown    HOME MEDICATIONS: Outpatient Medications Prior to Visit  Medication Sig Dispense Refill  . aspirin-acetaminophen-caffeine (EXCEDRIN MIGRAINE) 250-250-65 MG tablet Take 1 tablet by mouth every 6 (six)  hours as needed for headache.    . divalproex (DEPAKOTE ER) 500 MG 24 hr tablet Take 2 tablets (1,000 mg total) by mouth daily. 180 tablet 3  . ibuprofen (ADVIL,MOTRIN) 200 MG tablet Take 400 mg by mouth every 6 (six) hours as needed for moderate pain.    Marland Kitchen. lisinopril-hydrochlorothiazide (PRINZIDE,ZESTORETIC) 20-25 MG tablet Take 1 tablet by mouth daily.   10   No facility-administered medications prior to visit.     PAST MEDICAL HISTORY: Past Medical History:  Diagnosis Date  . Bee sting allergy   . HTN (hypertension)   . Seizure (HCC)     PAST SURGICAL HISTORY: Past Surgical History:  Procedure Laterality Date  . NO PAST SURGERIES      FAMILY HISTORY: Family History  Problem Relation Age of Onset  . Hypertension Mother   . Heart attack Father   . Cancer Maternal Grandmother        breast    SOCIAL HISTORY: Social History   Social History  . Marital status: Married    Spouse name: Maralyn SagoSarah  . Number of children: 1  . Years of education: 5016   Occupational History  . [employer]     Self-employed   Social History Main Topics  . Smoking status: Never Smoker  . Smokeless tobacco: Never Used  . Alcohol use 0.0 oz/week     Comment: 5-6 drinks/week  . Drug use: No  . Sexual activity: Not on file   Other Topics Concern  . Not on file   Social History Narrative   Right-handed.   Lives at home with her wife.   2 cups caffeine/day.   One child.     PHYSICAL EXAM  Vitals:   03/18/17 0955  BP: 106/64  Pulse: (!) 53  Weight: 151 lb 12.8 oz (68.9 kg)  Height: 5\' 7"  (1.702 m)   Body mass index is 23.78 kg/m. Generalized: Well developed, in no acute distress  Head: normocephalic and atraumatic,. Oropharynx benign  Neck: Supple,  Musculoskeletal: No deformity   Neurological examination   Mentation: Alert oriented to time, place, history taking. Attention span and concentration appropriate. Recent and remote memory intact. Follows all commands  speech and language fluent.   Cranial nerve II-XII: Pupils were equal round reactive to light extraocular movements were full, visual field were full on confrontational test. Facial sensation and strength were normal. hearing was intact to finger rubbing bilaterally. Uvula tongue midline. head turning and shoulder shrug were normal and symmetric.Tongue protrusion into cheek strength was normal. Motor: normal bulk and tone, full strength in the BUE, BLE,  Sensory: normal and symmetric to light touch, in the upper and lower extremities  Coordination: finger-nose-finger, heel-to-shin bilaterally, no dysmetria Reflexes: Brachioradialis 2/2, biceps 2/2, triceps 2/2, patellar 2/2, Achilles 2/2, plantar responses were flexor bilaterally. Gait and Station: Rising up from seated position without assistance, normal stance, moderate stride, good arm swing, smooth turning, able to perform tiptoe, and heel walking without difficulty. Tandem gait is steady  DIAGNOSTIC DATA (LABS, IMAGING, TESTING)   ASSESSMENT AND PLAN  51 y.o. year old male  has a past medical history of HTN (hypertension); Seizure (HCC); and migraine headaches here to follow-up. Last seizure event 10/26/2016 after missing a week's worth of his medication.  PLAN; Continue Depakote at current dose , this is for seizure disorder and migraine prevention will refill for 1 year will refill after VPA level is back Call for any seizures or increase in headaches Some Labs were just done at primary care pt does not know results will sign to get results Will Check Depakote level today for  therapeutic level  F/U yearly and prn Nilda Riggs, [facility], [facility], APRN  [facility] [address], [address] [place], [place] [address] [phone]

## 2017-03-18 NOTE — Patient Instructions (Addendum)
Continue Depakote at current dose , this is for seizure disorder and migraine prevention will refill for 1 year Call for any seizures or increase in headaches Some Labs were just done at primary care pt does not know results will sign to get results Will Check Depakote level were therapeutic level F/U yearly and prn

## 2017-03-18 NOTE — Progress Notes (Signed)
I have reviewed and agreed above plan. 

## 2017-03-19 ENCOUNTER — Telehealth: Payer: Self-pay | Admitting: Nurse Practitioner

## 2017-03-19 ENCOUNTER — Other Ambulatory Visit: Payer: Self-pay | Admitting: Nurse Practitioner

## 2017-03-19 ENCOUNTER — Telehealth: Payer: Self-pay | Admitting: *Deleted

## 2017-03-19 LAB — VALPROIC ACID LEVEL: Valproic Acid Lvl: 81 ug/mL (ref 50–100)

## 2017-03-19 MED ORDER — DIVALPROEX SODIUM ER 500 MG PO TB24: 1000.0000 mg | ORAL_TABLET | Freq: Every day | ORAL | 3 refills | Status: DC

## 2017-03-19 NOTE — Telephone Encounter (Signed)
[facility]2017 CMP all within normal limits Lipid panel cholesterol 164 LDL 90

## 2017-03-19 NOTE — Telephone Encounter (Signed)
LMVM cell that lab results VPA good level.  Continue same dosing of depakote 1000mg  po daily.  Did refill to [facility] at [address].  Please call back if questions.

## 2017-03-19 NOTE — Telephone Encounter (Signed)
-----   Message from Nilda RiggsNancy Carolyn Martin, NP sent at 03/19/2017 10:04 AM EDT ----- Good level of Depakote continue same dose will reorder, please call the patient

## 2017-08-10 DIAGNOSIS — Z23 Encounter for immunization: Secondary | ICD-10-CM | POA: Diagnosis not present

## 2018-03-17 NOTE — Progress Notes (Signed)
[facility]  PATIENT: Jonathan [name] DOB: [birth year]   REASON FOR VISIT follow-up for seizure disorder and migraines HISTORY FROM: Patient    HISTORY OF PRESENT ILLNESS: HISTORY: Jonathan [name] is a 52 yo RH male,, is referred by emergency room, and his primary care physician from [facility] family practice, for evaluation of seizure In Feb 19th 2016, while in sleep, 2am, he was noticed by his wife having a seizure, grinding teeth, body stiffness, lasted for 3 minutes, followed by body limp, confusion afterward, he woke up in the ambulance, ED, he did bit his lip. He has no history of seizure, he complains of insomnia, recently started to take herb supplement. CT head wo in Feb 19th 2016 was normal. Lab, normal UA, CBC, BMP. He has occasional visual distortion at his right visual field, with associated headaches, movement made it worse. Intermittent, it can happen 2/day, or can happen every few month. He had similar episode in Feb 18th 2016 afternoon, the day before he had seizure, no headache, transient moment loss touch, his friend noticed that his face went blank.  UPDATE March 8th 2016:YY He is with his wife today, he complains of episodes of sudden onset confusions, difficulty talking, blurry vision, sometimes with flashing light, lasting for 1-2 minute. 2-3 times each week. His wife witness that he suddenly gets very quiet, still, confused, could not answer questions, last for 1-2 minutes. MRI of the brain was essentially normal, EEG showed generalized epileptiform discharges. I have started Keppra 500 mg twice a day December 29 2014 over the phone, he tolerates the medication well, but there was no significant change on the frequency of his confusion spells UPDATE 09/19/2015 Jonathan [name], 52 year old returns to follow-up for seizure disorder and migraines. He has been followed in the office previously by Dr. Terrace Arabia who last saw him 03/16/2015. He is currently on  Depakote ER 500 mg 2 tabs at bedtime. He had a mildly low platelet count at his last visit which will be repeated today. He denies any recent headaches. Last seizure activity 12/22/2014. He returned to driving in August. He has no new complaints UPDATE 05/11/2017CM Jonathan [name], 52 year old male returns for follow-up. He has a history of seizure disorder and migraines and is currently on Depakote ER 500 2 tablets at bedtime last seizure event occurred 12/22/2014. He had recent labs at his primary care and was told they were within normal limits. He has had mildly low platelet count in the past. He needs refills on his medications. He returns for reevaluation UPDATE 05/15/2018CM Jonathan [name], 52 year old male returns for follow-up with history of seizure disorder and migraine headaches. She was seen in the emergency room on 10/26/2016 after a witnessed seizure by his wife. He had been off of his Depakote for it at least 4 days and probably a week. Valproic acid level that day was less than 10. He returns for reevaluation today claiming  that he has been compliant with his Depakote and had further seizure activity since December 2017. He denies side effects to the Depakote. He also reports no migraine headaches. He returns for reevaluation  UPDATE 5/15/2019CM Jonathan [name], 52 year old male returns for follow-up with a history of seizure disorder and migraine headache.  Last seizure occurred in December 2017 after going off his medication for a few days.  He is currently on Depakote thousand milligrams daily.  He is having a migraine every 3 months relieved with Excedrin Migraine.  He is due to get labs for primary  care in several weeks.  He returns for reevaluation REVIEW OF SYSTEMS: Full 14 system review of systems performed and notable only for those listed, all others are neg:  Constitutional: neg  Cardiovascular: neg Ear/Nose/Throat: neg  Skin: neg Eyes: neg Respiratory: neg Gastroitestinal: neg    Hematology/Lymphatic: neg  Endocrine: neg Musculoskeletal:neg Allergy/Immunology: neg Neurological: Seizure on 10/26/2016, migraine headache every  80-months Psychiatric: neg Sleep : neg   ALLERGIES: Allergies  Allergen Reactions  . Bee Venom     unknown    HOME MEDICATIONS: Outpatient Medications Prior to Visit  Medication Sig Dispense Refill  . aspirin-acetaminophen-caffeine (EXCEDRIN MIGRAINE) 250-250-65 MG tablet Take 1 tablet by mouth every 6 (six) hours as needed for headache.    . divalproex (DEPAKOTE ER) 500 MG 24 hr tablet Take 2 tablets (1,000 mg total) by mouth daily. 180 tablet 3  . ibuprofen (ADVIL,MOTRIN) 200 MG tablet Take 400 mg by mouth every 6 (six) hours as needed for moderate pain.    Marland Kitchen lisinopril-hydrochlorothiazide (PRINZIDE,ZESTORETIC) 20-25 MG tablet Take 1 tablet by mouth daily.   10   No facility-administered medications prior to visit.     PAST MEDICAL HISTORY: Past Medical History:  Diagnosis Date  . Bee sting allergy   . HTN (hypertension)   . Migraines   . Seizure (HCC)     PAST SURGICAL HISTORY: Past Surgical History:  Procedure Laterality Date  . NO PAST SURGERIES      FAMILY HISTORY: Family History  Problem Relation Age of Onset  . Hypertension Mother   . Heart attack Father   . Cancer Maternal Grandmother        breast    SOCIAL HISTORY: Social History   Socioeconomic History  . Marital status: Married    Spouse name: [name]  . Number of children: 1  . Years of education: 71  . Highest education level: Not on file  Occupational History  . Occupation: [employer]    Comment: Self-employed  Social Needs  . Financial resource strain: Not on file  . Food insecurity:    Worry: Not on file    Inability: Not on file  . Transportation needs:    Medical: Not on file    Non-medical: Not on file  Tobacco Use  . Smoking status: Never Smoker  . Smokeless tobacco: Never Used  Substance and Sexual Activity  .  Alcohol use: Yes    Alcohol/week: 0.0 oz    Comment: 5-6 drinks/week  . Drug use: No  . Sexual activity: Not on file  Lifestyle  . Physical activity:    Days per week: Not on file    Minutes per session: Not on file  . Stress: Not on file  Relationships  . Social connections:    Talks on phone: Not on file    Gets together: Not on file    Attends religious service: Not on file    Active member of club or organization: Not on file    Attends meetings of clubs or organizations: Not on file    Relationship status: Not on file  . Intimate partner violence:    Fear of current or ex partner: Not on file    Emotionally abused: Not on file    Physically abused: Not on file    Forced sexual activity: Not on file  Other Topics Concern  . Not on file  Social History Narrative   Right-handed.   Lives at home with her wife.   2 cups  caffeine/day.   One child.     PHYSICAL EXAM  Vitals:   03/18/18 0803  BP: (!) 111/58  Pulse: (!) 57  Weight: 154 lb (69.9 kg)  Height:  (1.702 m)   Body mass index is 24.12 kg/m. Generalized: Well developed, in no acute distress  Head: normocephalic and atraumatic,. Oropharynx benign  Neck: Supple,  Musculoskeletal: No deformity   Neurological examination   Mentation: Alert oriented to time, place, history taking. Attention span and concentration appropriate. Recent and remote memory intact. Follows all commands speech and language fluent.   Cranial nerve II-XII: Pupils were equal round reactive to light extraocular movements were full, visual field were full on confrontational test. Facial sensation and strength were normal. hearing was intact to finger rubbing bilaterally. Uvula tongue midline. head turning and shoulder shrug were normal and symmetric.Tongue protrusion into cheek strength was normal. Motor: normal bulk and tone, full strength in the BUE, BLE,  Sensory: normal and symmetric to light touch, in the upper and lower  extremities  Coordination: finger-nose-finger, heel-to-shin bilaterally, no dysmetria Reflexes: Brachioradialis 2/2, biceps 2/2, triceps 2/2, patellar 2/2, Achilles 2/2, plantar responses were flexor bilaterally. Gait and Station: Rising up from seated position without assistance, normal stance, moderate stride, good arm swing, smooth turning, able to perform tiptoe, and heel walking without difficulty. Tandem gait is steady  DIAGNOSTIC DATA (LABS, IMAGING, TESTING)   ASSESSMENT AND PLAN  52 y.o. year old male  has a past medical history of HTN (hypertension); Seizure (HCC); and migraine headaches here to follow-up. Last seizure event 10/26/2016 after missing a week's worth of his medication.    PLAN; Continue Depakote at current dose , this is for seizure disorder and migraine prevention will refill for 1 year  Call for any seizures or increase in headaches Labs due  at primary care please have copy sent to [place]  F/U yearly and prn Nilda Riggs, [facility], [facility], APRN  [facility] [address] [place], [place] [address] [phone]

## 2018-03-18 ENCOUNTER — Ambulatory Visit (INDEPENDENT_AMBULATORY_CARE_PROVIDER_SITE_OTHER): Payer: BLUE CROSS/BLUE SHIELD | Admitting: Nurse Practitioner

## 2018-03-18 ENCOUNTER — Encounter: Payer: Self-pay | Admitting: Nurse Practitioner

## 2018-03-18 VITALS — BP 111/58 | HR 57 | Ht 67.0 in | Wt 154.0 lb

## 2018-03-18 DIAGNOSIS — R569 Unspecified convulsions: Secondary | ICD-10-CM | POA: Diagnosis not present

## 2018-03-18 DIAGNOSIS — G43109 Migraine with aura, not intractable, without status migrainosus: Secondary | ICD-10-CM | POA: Diagnosis not present

## 2018-03-18 MED ORDER — DIVALPROEX SODIUM ER 500 MG PO TB24: 1000.0000 mg | ORAL_TABLET | Freq: Every day | ORAL | 3 refills | Status: DC

## 2018-03-18 NOTE — Patient Instructions (Signed)
Continue Depakote at current dose , this is for seizure disorder and migraine prevention will refill for 1 year  Call for any seizures or increase in headaaches Labs due  at primary care please have copy sent to [place]  F/U yearly and prn

## 2018-03-24 NOTE — Progress Notes (Signed)
I have reviewed and agreed above plan. 

## 2018-04-01 DIAGNOSIS — Z1211 Encounter for screening for malignant neoplasm of colon: Secondary | ICD-10-CM | POA: Diagnosis not present

## 2018-04-01 DIAGNOSIS — Z125 Encounter for screening for malignant neoplasm of prostate: Secondary | ICD-10-CM | POA: Diagnosis not present

## 2018-04-01 DIAGNOSIS — I10 Essential (primary) hypertension: Secondary | ICD-10-CM | POA: Diagnosis not present

## 2018-04-01 DIAGNOSIS — Z0000 Encounter for general adult medical examination without abnormal findings: Secondary | ICD-10-CM | POA: Diagnosis not present

## 2018-04-01 DIAGNOSIS — E785 Hyperlipidemia, unspecified: Secondary | ICD-10-CM | POA: Diagnosis not present

## 2018-04-18 ENCOUNTER — Other Ambulatory Visit: Payer: Self-pay | Admitting: Nurse Practitioner

## 2018-04-20 NOTE — Telephone Encounter (Signed)
Was refilled in [place] x 1 year.

## 2018-05-28 DIAGNOSIS — Z1211 Encounter for screening for malignant neoplasm of colon: Secondary | ICD-10-CM | POA: Diagnosis not present

## 2018-05-28 DIAGNOSIS — D126 Benign neoplasm of colon, unspecified: Secondary | ICD-10-CM | POA: Diagnosis not present

## 2018-06-02 DIAGNOSIS — D126 Benign neoplasm of colon, unspecified: Secondary | ICD-10-CM | POA: Diagnosis not present

## 2018-08-11 IMAGING — CR DG CHEST 2V
2 series · 2 of 2 positions shown · non-contrast
Comparison: Chest x-ray of 12/23/2014

CLINICAL DATA: Coughing congestion for 10 days, recent fever

EXAM:
CHEST  2 VIEW

[w chest pa]
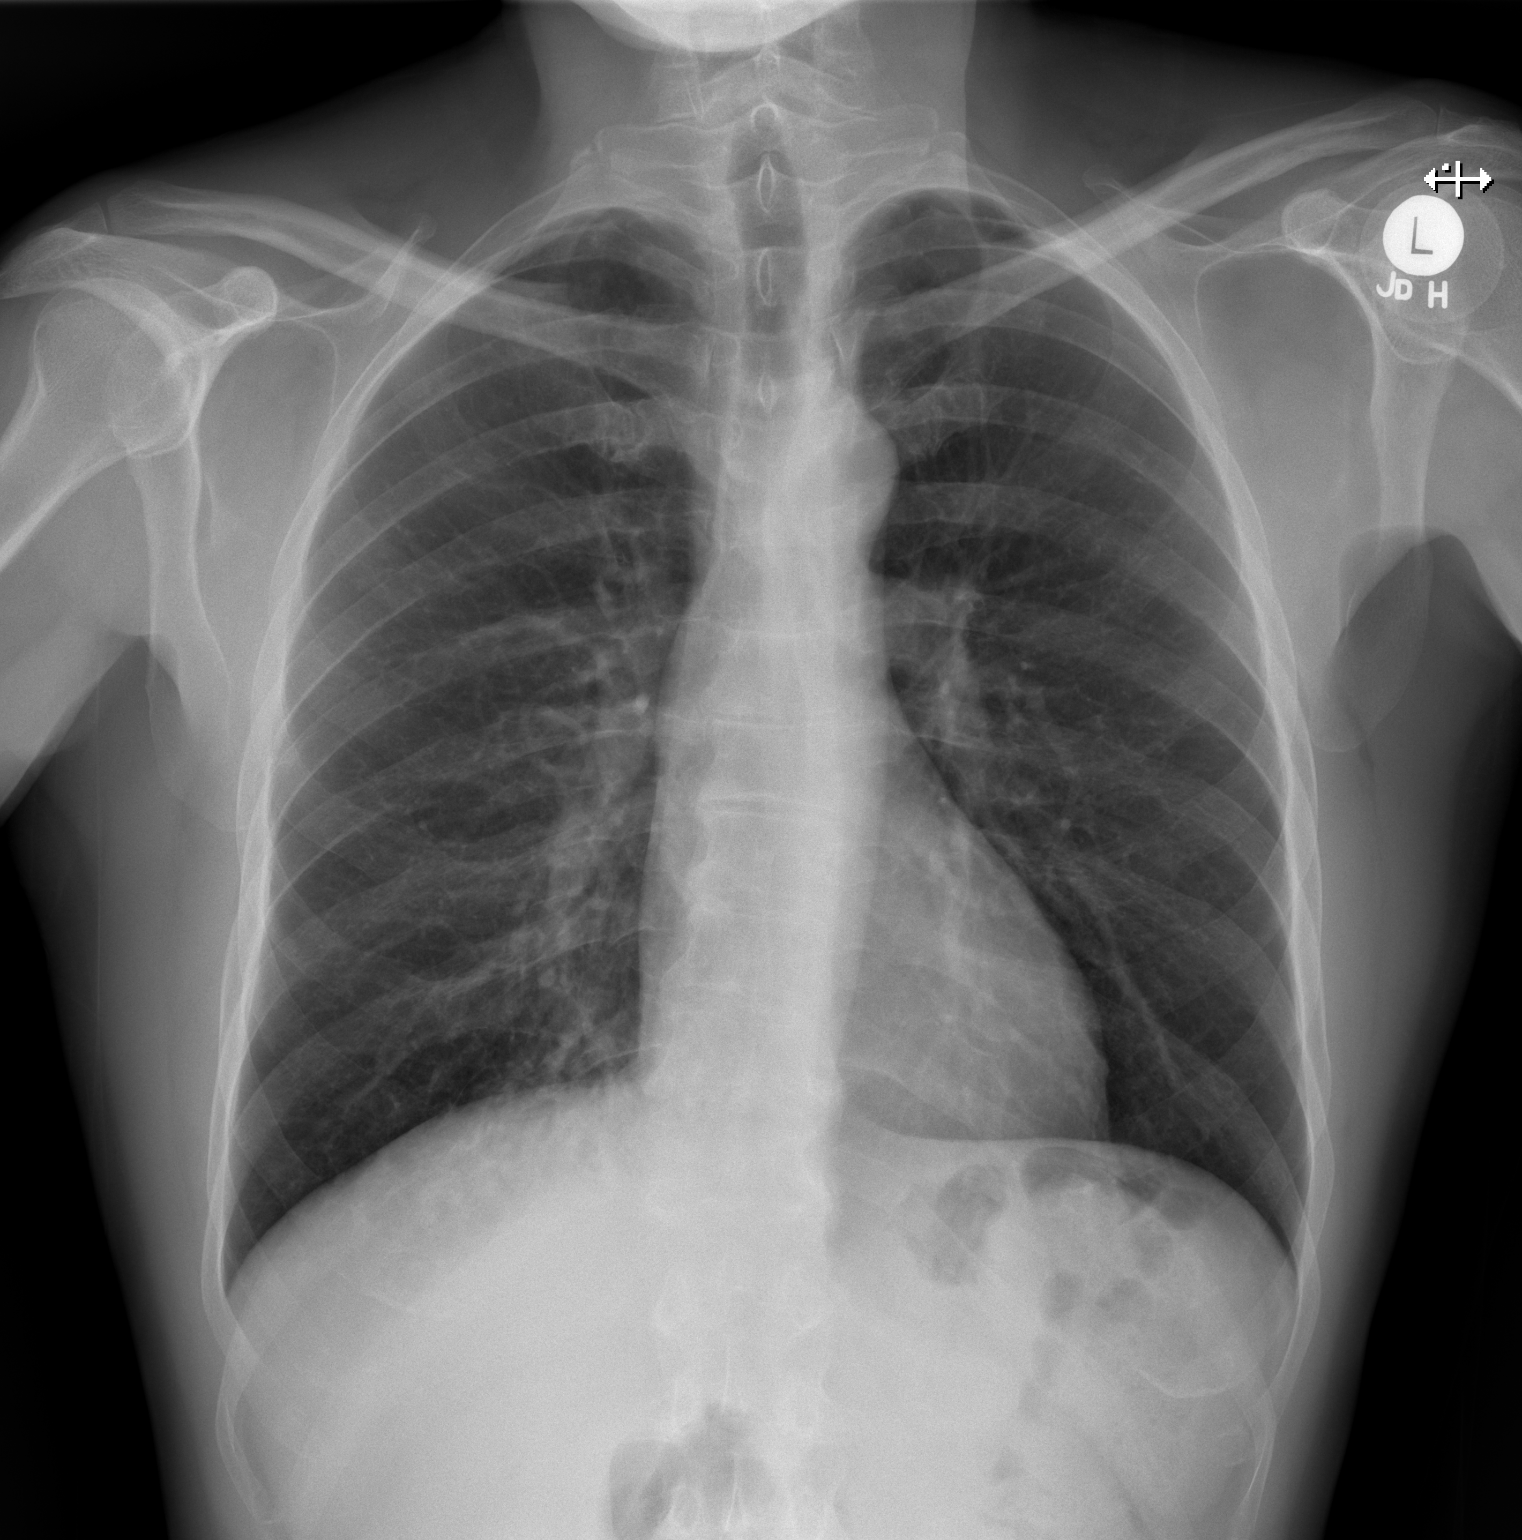

[w chest lat]
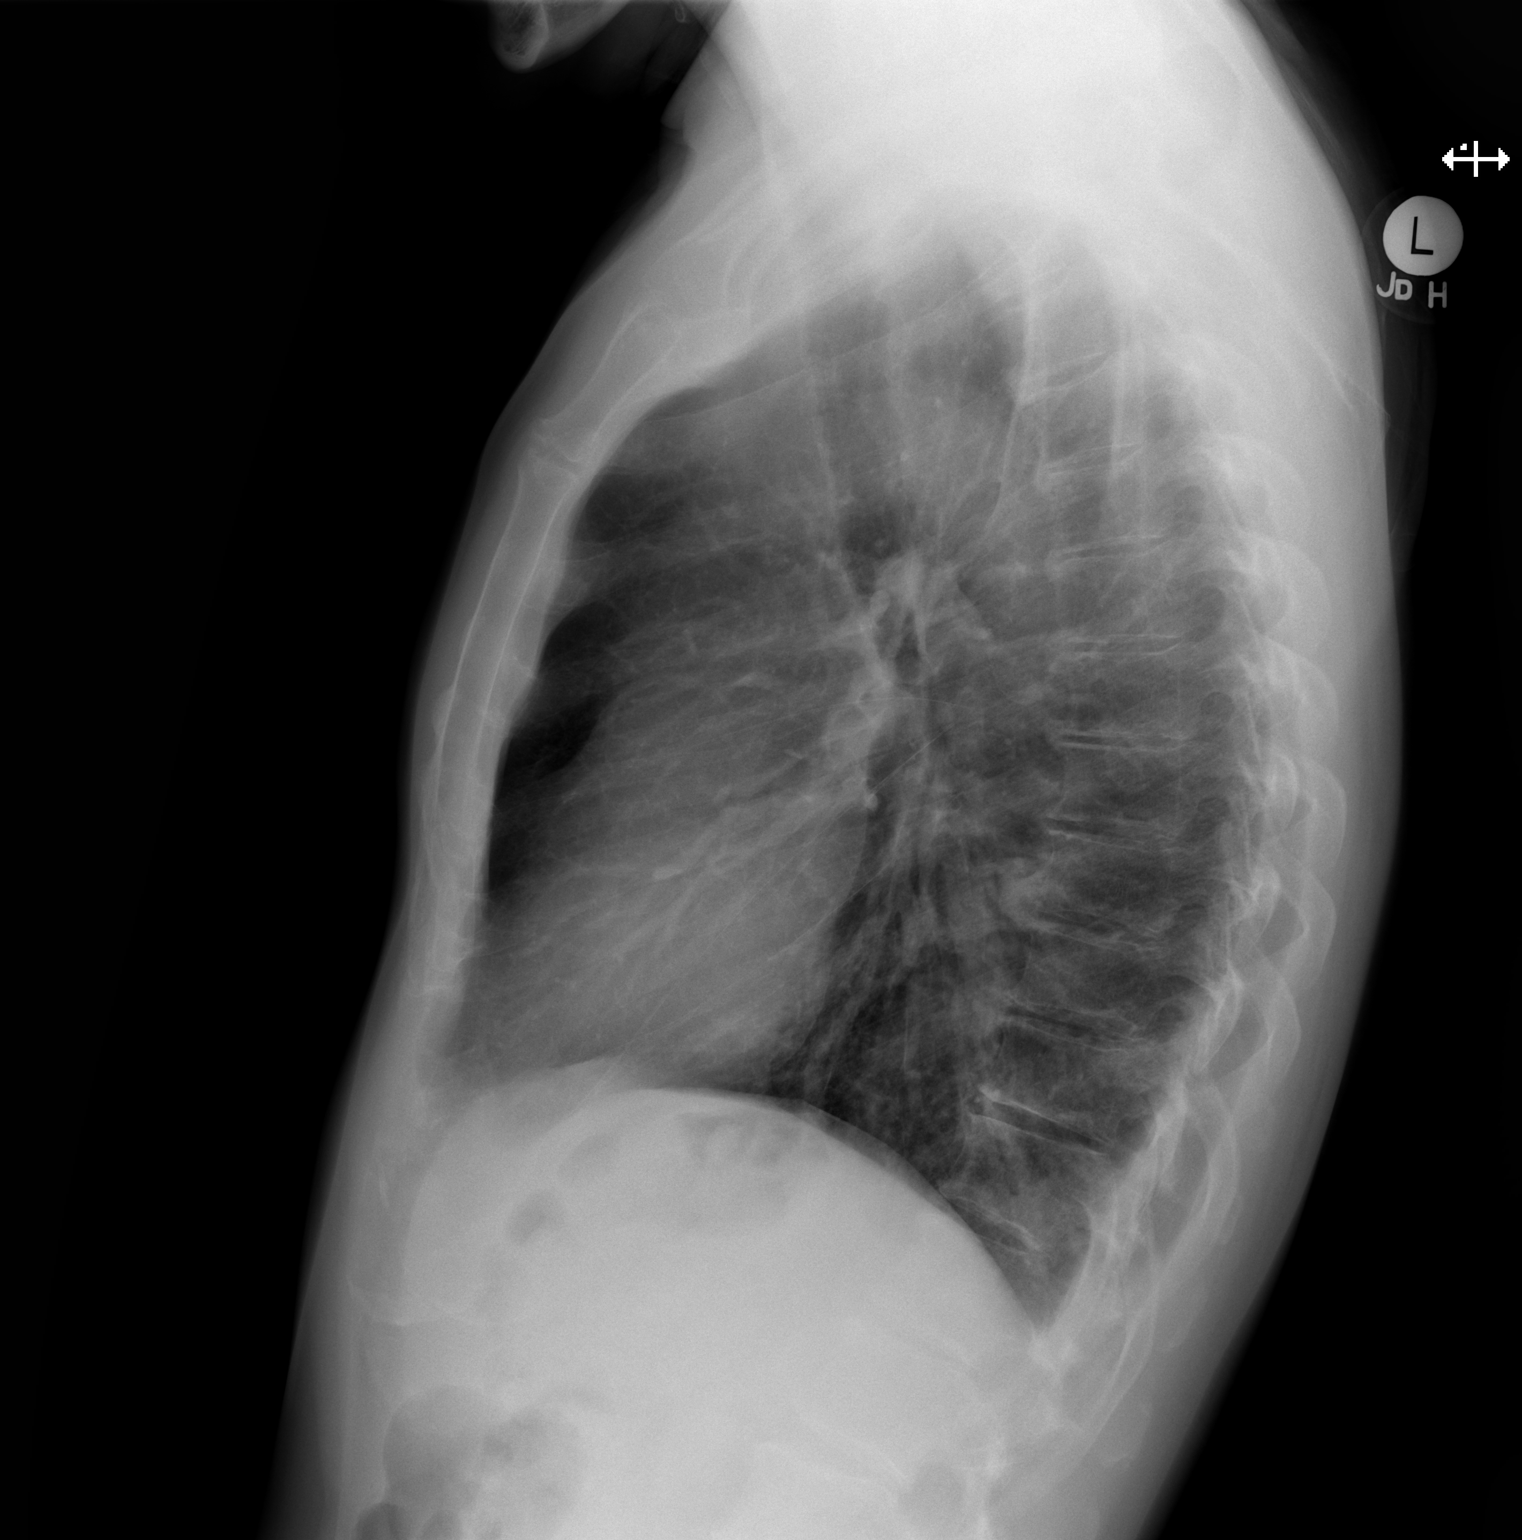

[2 of 2 positions shown; findings below may reference images not displayed]

FINDINGS: No active infiltrate or effusion is seen. Mediastinal and hilar
contours are unremarkable. The heart is within normal limits in
size. No bony abnormality is seen.
IMPRESSION: No active cardiopulmonary disease.

## 2018-10-07 NOTE — Progress Notes (Signed)
Mr. Jonathan [name] received his flu shot to his LT deltoid on 12/2 at the [facility] by the undersigned. Lot#[id number] NDC: [id number] Mfg: [id number] Exp: 05/04/19

## 2019-03-18 ENCOUNTER — Telehealth: Payer: Self-pay

## 2019-03-18 NOTE — Telephone Encounter (Signed)
Unable to get in contact with the patient to convert his office visit with [name] into a doxy.me visit. I left a voicemail asking him to return my call. Office number was provided.   If patient calls back please convert his office visit into a doxy.me visit with [name].

## 2019-03-22 ENCOUNTER — Other Ambulatory Visit: Payer: Self-pay

## 2019-03-22 ENCOUNTER — Encounter: Payer: Self-pay | Admitting: Neurology

## 2019-03-22 ENCOUNTER — Ambulatory Visit (INDEPENDENT_AMBULATORY_CARE_PROVIDER_SITE_OTHER): Payer: BLUE CROSS/BLUE SHIELD | Admitting: Neurology

## 2019-03-22 ENCOUNTER — Ambulatory Visit: Payer: BLUE CROSS/BLUE SHIELD | Admitting: Nurse Practitioner

## 2019-03-22 DIAGNOSIS — R569 Unspecified convulsions: Secondary | ICD-10-CM | POA: Diagnosis not present

## 2019-03-22 DIAGNOSIS — Z5181 Encounter for therapeutic drug level monitoring: Secondary | ICD-10-CM

## 2019-03-22 NOTE — Progress Notes (Signed)
Virtual Visit via Video Note  I connected with Jonathan [name] on 03/22/19 at  9:15 AM EDT by a video enabled telemedicine application and verified that I am speaking with the correct person using two identifiers.  Location: Patient: At his home Provider: In the office    I discussed the limitations of evaluation and management by telemedicine and the availability of in person appointments. The patient expressed understanding and agreed to proceed.  History of Present Illness: HISTORY: Jonathan [name] is a 53 yo RH male,, is referred by emergency room, and his primary care physician from [facility], for evaluation of seizure In Feb 19th 2016, while in sleep, 2am, he was noticed by his wife having a seizure, grinding teeth, body stiffness, lasted for 3 minutes, followed by body limp, confusion afterward, he woke up in the ambulance, ED, he did bit his lip. He has no history of seizure, he complains of insomnia, recently started to take herb supplement. CT head wo in Feb 19th 2016 was normal. Lab, normal UA, CBC, BMP. He has occasional visual distortion at his right visual field, with associated headaches, movement made it worse. Intermittent, it can happen 2/day, or can happen every few month. He had similar episode in Feb 18th 2016 afternoon, the day before he had seizure, no headache, transient moment loss touch, his friend noticed that his face went blank.  UPDATE March 8th 2016:YY He is with his wife today, he complains of episodes of sudden onset confusions, difficulty talking, blurry vision, sometimes with flashing light, lasting for 1-2 minute. 2-3 times each week. His wife witness that he suddenly gets very quiet, still, confused, could not answer questions, last for 1-2 minutes. MRI of the brain was essentially normal, EEG showed generalized epileptiform discharges. I have started Keppra 500 mg twice a day December 29 2014 over the phone, he tolerates the medication  well, but there was no significant change on the frequency of his confusion spells UPDATE 09/19/2015 Jonathan [name], 53 year old returns to follow-up for seizure disorder and migraines. He has been followed in the office previously by Dr. Terrace Arabia who last saw him 03/16/2015. He is currently on Depakote ER 500 mg 2 tabs at bedtime. He had a mildly low platelet count at his last visit which will be repeated today. He denies any recent headaches. Last seizure activity 12/22/2014. He returned to driving in August. He has no new complaints UPDATE 05/11/2017CM Jonathan [name], 53 year old male returns for follow-up. He has a history of seizure disorder and migraines and is currently on Depakote ER 500 2 tablets at bedtime last seizure event occurred 12/22/2014. He had recent labs at his primary care and was told they were within normal limits. He has had mildly low platelet count in the past. He needs refills on his medications. He returns for reevaluation UPDATE 05/15/2018CM Jonathan [name], 53 year old male returns for follow-up with history of seizure disorder and migraine headaches. She was seen in the emergency room on 10/26/2016 after a witnessed seizure by his wife. He had been off of his Depakote for it at least 4 days and probably a week. Valproic acid level that day was less than 10. He returns for reevaluation today claiming  that he has been compliant with his Depakote and had further seizure activity since December 2017. He denies side effects to the Depakote. He also reports no migraine headaches. He returns for reevaluation  UPDATE 5/15/2019CM Jonathan [name], 53 year old male returns for follow-up with a history of seizure  disorder and migraine headache.  Last seizure occurred in December 2017 after going off his medication for a few days.  He is currently on Depakote thousand milligrams daily.  He is having a migraine every 3 months relieved with Excedrin Migraine.  He is due to get labs for primary care in  several weeks.  He returns for reevaluation  Update 03/22/2019 SS: Jonathan [name] is a 53 year old male with history of seizure disorder and migraine headache.  He is currently taking Depakote ER 500 mg, 2 tablets daily.  His last seizure was in December 2017.  He reports he is doing great.  He reports compliance with his medications.  He has been tolerating medications well.  He denies any new problems or concerns.  He is self-employed, has a Set designerpiano shop. He feels the Depakote is a good match for him. He indicates it has been awhile since he had blood work done at his primary care doctor.    Observations/Objective: Alert, answers questions appropriately, speech is clear and concise, facial symmetry noted, follows commands  Assessment and Plan: 1. Seizures 2. Headaches   Overall he is doing quite well.  He has not had any recurrent seizures.  His headaches are under good control.  I will check blood work today.  I will send in a refill once the blood work results.  At this point he will continue on current dose of Depakote, Depakote ER 500 mg, 2 tablets daily.   Follow Up Instructions: 1 year for office visit   I discussed the assessment and treatment plan with the patient. The patient was provided an opportunity to ask questions and all were answered. The patient agreed with the plan and demonstrated an understanding of the instructions.   The patient was advised to call back or seek an in-person evaluation if the symptoms worsen or if the condition fails to improve as anticipated.  I provided 20 minutes of non-face-to-face time during this encounter.   Otila KluverSarah Joelynn Dust, AGNP-C, DNP  [facility] [address], [address] [place], [place] [id number] [phone]

## 2019-03-24 NOTE — Progress Notes (Signed)
I have reviewed and agreed above plan. 

## 2019-04-12 ENCOUNTER — Other Ambulatory Visit: Payer: Self-pay | Admitting: *Deleted

## 2019-04-12 MED ORDER — DIVALPROEX SODIUM ER 500 MG PO TB24: 1000.0000 mg | ORAL_TABLET | Freq: Every day | ORAL | 3 refills | Status: DC

## 2019-04-30 DIAGNOSIS — Z0000 Encounter for general adult medical examination without abnormal findings: Secondary | ICD-10-CM | POA: Diagnosis not present

## 2019-05-14 DIAGNOSIS — E785 Hyperlipidemia, unspecified: Secondary | ICD-10-CM | POA: Diagnosis not present

## 2019-05-14 DIAGNOSIS — I10 Essential (primary) hypertension: Secondary | ICD-10-CM | POA: Diagnosis not present

## 2019-05-14 DIAGNOSIS — Z125 Encounter for screening for malignant neoplasm of prostate: Secondary | ICD-10-CM | POA: Diagnosis not present

## 2019-07-22 DIAGNOSIS — Z125 Encounter for screening for malignant neoplasm of prostate: Secondary | ICD-10-CM | POA: Diagnosis not present

## 2020-03-06 ENCOUNTER — Telehealth: Payer: Self-pay

## 2020-03-06 NOTE — Telephone Encounter (Signed)
Received refill request for depakote from [facility]. Pt is overdue for an appt. I called pt to discuss. No answer, left a message asking him to call me back.  If pt calls back please schedule him for a f/u with [name], NP and let a clinical staff member know so that his depakote may be refilled.

## 2020-03-07 ENCOUNTER — Other Ambulatory Visit: Payer: Self-pay

## 2020-03-07 ENCOUNTER — Telehealth: Payer: Self-pay

## 2020-03-07 ENCOUNTER — Encounter: Payer: Self-pay | Admitting: Neurology

## 2020-03-07 ENCOUNTER — Ambulatory Visit (INDEPENDENT_AMBULATORY_CARE_PROVIDER_SITE_OTHER): Payer: 59 | Admitting: Neurology

## 2020-03-07 VITALS — BP 127/83 | HR 74 | Temp 98.3°F | Ht 67.0 in | Wt 146.0 lb

## 2020-03-07 DIAGNOSIS — R569 Unspecified convulsions: Secondary | ICD-10-CM | POA: Diagnosis not present

## 2020-03-07 DIAGNOSIS — G43109 Migraine with aura, not intractable, without status migrainosus: Secondary | ICD-10-CM | POA: Diagnosis not present

## 2020-03-07 MED ORDER — DIVALPROEX SODIUM ER 500 MG PO TB24: 1000.0000 mg | ORAL_TABLET | Freq: Every day | ORAL | 3 refills | Status: DC

## 2020-03-07 NOTE — Patient Instructions (Signed)
It was nice to see you today! Continue on Depakote  Check blood work today  See you in 1 year or sooner if needed

## 2020-03-07 NOTE — Telephone Encounter (Signed)
Looks like he was added to the schedule today. I will fill his Depakote then.

## 2020-03-07 NOTE — Telephone Encounter (Signed)
Patient arrived in lobby bringing in Bright ins; however, it is not active at this time. He will call and get it fixed. He needs refills of his Depakote ER - best call back [phone]

## 2020-03-07 NOTE — Progress Notes (Signed)
PATIENT: Jonathan [name] DOB: 1966-09-19  REASON FOR VISIT: follow up HISTORY FROM: patient  HISTORY OF PRESENT ILLNESS: Today 03/07/20  HISTORY  HISTORY: Jonathan [name] is a 54 yo RH male,, is referred by emergency room, and his primary care physician from [facility] family practice, for evaluation of seizure In Feb 19th 2016, while in sleep, 2am, he was noticed by his wife having a seizure, grinding teeth, body stiffness, lasted for 3 minutes, followed by body limp, confusion afterward, he woke up in the ambulance, ED, he did bit his lip. He has no history of seizure, he complains of insomnia, recently started to take herb supplement. CT head wo in Feb 19th 2016 was normal. Lab, normal UA, CBC, BMP. He has occasional visual distortion at his right visual field, with associated headaches, movement made it worse. Intermittent, it can happen 2/day, or can happen every few month. He had similar episode in Feb 18th 2016 afternoon, the day before he had seizure, no headache, transient [name] loss touch, his friend noticed that his face went blank.  UPDATE March 8th 2016:YY He is with his wife today, he complains of episodes of sudden onset confusions, difficulty talking, blurry vision, sometimes with flashing light, lasting for 1-2 minute. 2-3 times each week. His wife witness that he suddenly gets very quiet, still, confused, could not answer questions, last for 1-2 minutes. MRI of the brain was essentially normal, EEG showed generalized epileptiform discharges. I have started Keppra 500 mg twice a day December 29 2014 over the phone, he tolerates the medication well, but there was no significant change on the frequency of his confusion spells UPDATE 11/15/2016[name], 54 year old returns to follow-up for seizure disorder and migraines. He has been followed in the office previously by Dr. Krista Blue who last saw him 03/16/2015. He is currently on Depakote ER 500 mg 2 tabs at bedtime. He had a  mildly low platelet count at his last visit which will be repeated today. He denies any recent headaches. Last seizure activity 12/22/2014. He returned to driving in August. He has no new complaints UPDATE 05/11/2017CMMr. [name], 53 year old male returns for follow-up. He has a history of seizure disorder and migraines and is currently on Depakote ER 500 2 tablets at bedtime last seizure event occurred 12/22/2014. He had recent labs at his primary care and was told they were within normal limits. He has had mildly low platelet count in the past. He needs refills on his medications. He returns for reevaluation UPDATE 05/15/2018CMMr.[name], 54 year old male returns for follow-up with history of seizure disorder and migraine headaches. She was seen in the emergency room on 10/26/2016 after a witnessed seizure by his wife. He had been off of his Depakote for it at least 4 days and probably a week. Valproic acid level that day was less than 10. He returns for reevaluation today claiming that he has been compliant with his Depakote and had further seizure activity since December 2017. He denies side effects to the Depakote. He also reports no migraine headaches. He returns for reevaluation  UPDATE5/15/2019CMMr.[name],54 year old male returns for follow-up with a history of seizure disorder and migraine headache. Last seizure occurred in December 2017 after going off his medication for a few days. He is currently on Depakote thousand milligrams daily. He is having a migraine every 3 months relieved with Excedrin Migraine.He is due to get labs for primary care in several weeks.He returns for reevaluation  Update 03/22/2019 SS: Jonathan [name] is a 54 year old  male with history of seizure disorder and migraine headache.  He is currently taking Depakote ER 500 mg, 2 tablets daily.  His last seizure was in December 2017.  He reports he is doing great.  He reports compliance with his medications.  He has  been tolerating medications well.  He denies any new problems or concerns.  He is self-employed, has a Set designer. He feels the Depakote is a good match for him. He indicates it has been awhile since he had blood work done at his primary care doctor.   Update Mar 07, 2020 SS: Jonathan [name] is a 54 year old male with history of seizure disorder and migraine headache.  He remains on Depakote ER 500 mg, 2 tablets at bedtime. His last seizure was in December 2017, he has only ever had 2 seizures, both nocturnal.  He is self-employed, has a Set designer.  His seizures and migraines are currently under good control. He may occasionally have a flash of light, like an aura, but no headache results.  His overall health is good.  He presents today for evaluation unaccompanied.  REVIEW OF SYSTEMS: Out of a complete 14 system review of symptoms, the patient complains only of the following symptoms, and all other reviewed systems are negative.  Seizure, headache  ALLERGIES: Allergies  Allergen Reactions  . Bee Venom     unknown    HOME MEDICATIONS: Outpatient Medications Prior to Visit  Medication Sig Dispense Refill  . aspirin-acetaminophen-caffeine (EXCEDRIN MIGRAINE) 250-250-65 MG tablet Take 1 tablet by mouth every 6 (six) hours as needed for headache.    . divalproex (DEPAKOTE ER) 500 MG 24 hr tablet Take 2 tablets (1,000 mg total) by mouth daily. 180 tablet 3  . ibuprofen (ADVIL,MOTRIN) 200 MG tablet Take 400 mg by mouth every 6 (six) hours as needed for moderate pain.    Marland Kitchen lisinopril-hydrochlorothiazide (PRINZIDE,ZESTORETIC) 20-25 MG tablet Take 1 tablet by mouth daily.   10   No facility-administered medications prior to visit.    PAST MEDICAL HISTORY: Past Medical History:  Diagnosis Date  . Bee sting allergy   . HTN (hypertension)   . Migraines   . Seizure (HCC)     PAST SURGICAL HISTORY: Past Surgical History:  Procedure Laterality Date  . NO PAST SURGERIES      FAMILY  HISTORY: Family History  Problem Relation Age of Onset  . Hypertension Mother   . Heart attack Father   . Cancer Maternal Grandmother        breast    SOCIAL HISTORY: Social History   Socioeconomic History  . Marital status: Married    Spouse name: [name]  . Number of children: 1  . Years of education: 16  . Highest education level: Not on file  Occupational History  . Occupation: [employer]    Comment: Self-employed  Tobacco Use  . Smoking status: Never Smoker  . Smokeless tobacco: Never Used  Substance and Sexual Activity  . Alcohol use: Yes    Alcohol/week: 0.0 standard drinks    Comment: 5-6 drinks/week  . Drug use: No  . Sexual activity: Not on file  Other Topics Concern  . Not on file  Social History Narrative   Right-handed.   Lives at home with her wife.   2 cups caffeine/day.   One child.   Social Determinants of Health   Financial Resource Strain:   . Difficulty of Paying Living Expenses:   Food Insecurity:   . Worried About Radiation protection practitioner  of Food in the Last Year:   . Ran Out of Food in the Last Year:   Transportation Needs:   . Lack of Transportation (Medical):   Marland Kitchen Lack of Transportation (Non-Medical):   Physical Activity:   . Days of Exercise per Week:   . Minutes of Exercise per Session:   Stress:   . Feeling of Stress :   Social Connections:   . Frequency of Communication with Friends and Family:   . Frequency of Social Gatherings with Friends and Family:   . Attends Religious Services:   . Active Member of Clubs or Organizations:   . Attends Banker Meetings:   Marland Kitchen Marital Status:   Intimate Partner Violence:   . Fear of Current or Ex-Partner:   . Emotionally Abused:   Marland Kitchen Physically Abused:   . Sexually Abused:    PHYSICAL EXAM  Vitals:   03/07/20 1230  BP: 127/83  Pulse: 74  Temp: 98.3 F (36.8 C)  Weight: 146 lb (66.2 kg)  Height: 5\' 7"  (1.702 m)   Body mass index is 22.87 kg/m.  Generalized: Well  developed, in no acute distress   Neurological examination  Mentation: Alert oriented to time, place, history taking. Follows all commands speech and language fluent Cranial nerve II-XII: Pupils were equal round reactive to light. Extraocular movements were full, visual field were full on confrontational test. Facial sensation and strength were normal.  Head turning and shoulder shrug  were normal and symmetric. Motor: The motor testing reveals 5 over 5 strength of all 4 extremities. Good symmetric motor tone is noted throughout.  Sensory: Sensory testing is intact to soft touch on all 4 extremities. No evidence of extinction is noted.  Coordination: Cerebellar testing reveals good finger-nose-finger and heel-to-shin bilaterally.  Gait and station: Gait is normal. Tandem gait is normal. Romberg is negative. No drift is seen.  Reflexes: Deep tendon reflexes are symmetric and normal bilaterally.   DIAGNOSTIC DATA (LABS, IMAGING, TESTING) - I reviewed patient records, labs, notes, testing and imaging myself where available.  Lab Results  Component Value Date   WBC 6.6 10/26/2016   HGB 12.7 (L) 10/26/2016   HCT 35.9 (L) 10/26/2016   MCV 90.9 10/26/2016   PLT 147 (L) 10/26/2016      Component Value Date/Time   NA 129 (L) 10/26/2016 2315   NA 138 03/16/2015 1017   K 3.2 (L) 10/26/2016 2315   CL 98 (L) 10/26/2016 2315   CO2 23 10/26/2016 2315   GLUCOSE 106 (H) 10/26/2016 2315   BUN 16 10/26/2016 2315   BUN 17 03/16/2015 1017   CREATININE 1.03 10/26/2016 2315   CALCIUM 8.8 (L) 10/26/2016 2315   PROT 6.8 03/16/2015 1017   ALBUMIN 4.3 03/16/2015 1017   AST 26 03/16/2015 1017   ALT 20 03/16/2015 1017   ALKPHOS 62 03/16/2015 1017   BILITOT 0.3 03/16/2015 1017   GFRNONAA >60 10/26/2016 2315   GFRAA >60 10/26/2016 2315   No results found for: CHOL, HDL, LDLCALC, LDLDIRECT, TRIG, CHOLHDL No results found for: 10/28/2016 No results found for: VITAMINB12 No results found for:  TSH    ASSESSMENT AND PLAN 54 y.o. year old male  has a past medical history of Bee sting allergy, HTN (hypertension), Migraines, and Seizure (HCC). here with:  1.  Seizures 2.  Headaches -Currently under excellent control, last seizure December 2017 -Continue Depakote ER 500 mg, 2 tablets at bedtime, refill sent for 1 year -Will check routine blood work  today -Call for recurrent seizure, otherwise follow-up 1 year or sooner if needed  I spent 20 minutes of face-to-face and non-face-to-face time with patient.  This included previsit chart review, lab review, study review, order entry, electronic health record documentation, patient education.  Margie Ege, AGNP-C, DNP 03/07/2020, 12:33 PM [facility] [address], [address] [place], [place] [address] [phone]

## 2020-03-08 LAB — VALPROIC ACID LEVEL: Valproic Acid Lvl: 70 ug/mL (ref 50–100)

## 2020-03-08 LAB — COMPREHENSIVE METABOLIC PANEL
ALT: 33 IU/L (ref 0–44)
AST: 52 IU/L — ABNORMAL HIGH (ref 0–40)
Albumin/Globulin Ratio: 1.8 (ref 1.2–2.2)
Albumin: 4.4 g/dL (ref 3.8–4.9)
Alkaline Phosphatase: 67 IU/L (ref 39–117)
BUN/Creatinine Ratio: 18 (ref 9–20)
BUN: 16 mg/dL (ref 6–24)
Bilirubin Total: 0.4 mg/dL (ref 0.0–1.2)
CO2: 26 mmol/L (ref 20–29)
Calcium: 9.6 mg/dL (ref 8.7–10.2)
Chloride: 100 mmol/L (ref 96–106)
Creatinine, Ser: 0.91 mg/dL (ref 0.76–1.27)
GFR calc Af Amer: 112 mL/min/{1.73_m2} (ref >59)
GFR calc non Af Amer: 97 mL/min/{1.73_m2} (ref >59)
Globulin, Total: 2.4 g/dL (ref 1.5–4.5)
Glucose: 88 mg/dL (ref 65–99)
Potassium: 4 mmol/L (ref 3.5–5.2)
Sodium: 140 mmol/L (ref 134–144)
Total Protein: 6.8 g/dL (ref 6.0–8.5)

## 2020-03-08 LAB — CBC WITH DIFFERENTIAL/PLATELET
Basophils Absolute: 0.1 10*3/uL (ref 0.0–0.2)
Basos: 1 %
EOS (ABSOLUTE): 0.1 10*3/uL (ref 0.0–0.4)
Eos: 3 %
Hematocrit: 41.5 % (ref 37.5–51.0)
Hemoglobin: 14.3 g/dL (ref 13.0–17.7)
Immature Grans (Abs): 0 10*3/uL (ref 0.0–0.1)
Immature Granulocytes: 0 %
Lymphocytes Absolute: 1.7 10*3/uL (ref 0.7–3.1)
Lymphs: 38 %
MCH: 32.9 pg (ref 26.6–33.0)
MCHC: 34.5 g/dL (ref 31.5–35.7)
MCV: 96 fL (ref 79–97)
Monocytes Absolute: 0.7 10*3/uL (ref 0.1–0.9)
Monocytes: 16 %
Neutrophils Absolute: 1.9 10*3/uL (ref 1.4–7.0)
Neutrophils: 42 %
Platelets: 121 10*3/uL — ABNORMAL LOW (ref 150–450)
RBC: 4.34 x10E6/uL (ref 4.14–5.80)
RDW: 11.6 % (ref 11.6–15.4)
WBC: 4.5 10*3/uL (ref 3.4–10.8)

## 2020-03-13 ENCOUNTER — Telehealth: Payer: Self-pay | Admitting: *Deleted

## 2020-03-13 NOTE — Telephone Encounter (Signed)
Yes, thrombocytopenia can be side effect of Depakote.

## 2020-03-13 NOTE — Telephone Encounter (Signed)
Spoke with pt and relayed that the lab results per SS/NP :  Labs show low platelets 121, lower than prior. Slight elevated AST 52. Let's recheck this in 3 months, I have sent myself a reminder. This could be due to Depakote. For liver, limit Tylenol/ETOH. He verbalized understanding.  Is the low plts caused by depakote?

## 2020-03-13 NOTE — Telephone Encounter (Signed)
-----   Message from Glean Salvo, NP sent at 03/08/2020  9:09 AM EDT ----- Please call the patient. Labs show low platelets 121, lower than prior. Slight elevated AST 52. Let's recheck this in 3 months, I have sent myself a reminder. This could be due to Depakote. For liver, limit Tylenol/ETOH.

## 2020-03-13 NOTE — Telephone Encounter (Signed)
I did relay with to pt and he appreciated call back.

## 2020-03-14 NOTE — Progress Notes (Signed)
I have reviewed and agreed above plan. 

## 2020-06-06 ENCOUNTER — Telehealth: Payer: Self-pay | Admitting: Neurology

## 2020-06-06 NOTE — Telephone Encounter (Signed)
Pt called stating that he had a seizure last night and is wanting to discuss it with the RN. Please advise.

## 2020-06-06 NOTE — Telephone Encounter (Signed)
I called pt. Around 3am this morning the pt's wife noticed that he was making guttural noises, gritting his teeth, and was unresponsive for 2-3 minutes. He woke up and slowly recovered to baseline, answering wife's questions appropriately. They believe that the pt had a seizure. He has not had a seizure in several years but he does have a history of nocturnal seizures. Pt denies missing recent doses of depakote. He denies any recent medication changes. He is sleeping well around 8 hours per night.   I advised pt that we likely will need to see him in the office for evaluation since he may have had a seizure. An appt was scheduled with [name], NP on 07/05/2020. Pt and wife are concerned about medications and would like a sooner appt. An appt was then made for 06/07/2020 at 8:00am with Aundra Millet, NP. Pt and wife would like an appt today with [name], NP or Dr. Terrace Arabia if those providers deem this necessary.  I advised pt that per [place] he cannot drive for 6 months following a seizure.  Pt and wife would like a call back once [name], NP has reviewed this info with any further recommendations. 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  I spoke with [name], NP. She is fine with the appt with Aundra Millet, NP tomorrow. Pt should rest today, not work, and continue his depakote as prescribed.  I called pt, spoke with he and his wife in an extended conversation reviewing Sarah's recommendations and also seizure safety guidelines. Pt will rest today and his wife will monitor him. He will take his depakote 1000mg  in the evening as usual. Pt verbalized understanding of recommendations and had no further questions.

## 2020-06-07 ENCOUNTER — Telehealth: Payer: Self-pay | Admitting: Adult Health

## 2020-06-07 ENCOUNTER — Ambulatory Visit (INDEPENDENT_AMBULATORY_CARE_PROVIDER_SITE_OTHER): Payer: 59 | Admitting: Adult Health

## 2020-06-07 VITALS — BP 92/54 | HR 48 | Ht 68.0 in | Wt 144.0 lb

## 2020-06-07 DIAGNOSIS — R569 Unspecified convulsions: Secondary | ICD-10-CM | POA: Diagnosis not present

## 2020-06-07 DIAGNOSIS — Z5181 Encounter for therapeutic drug level monitoring: Secondary | ICD-10-CM | POA: Diagnosis not present

## 2020-06-07 NOTE — Telephone Encounter (Signed)
Jonathan [name], Patient had one more question. He needed to clarify. He understands he has seizures night time only, he understands he can't drive for 6 months so is that the case just confirming. 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 Thank you

## 2020-06-07 NOTE — Patient Instructions (Signed)
Continue Depakote- Blood work today If you have any seizure events please let us know.   

## 2020-06-07 NOTE — Progress Notes (Signed)
PATIENT: Jonathan [name] DOB: 09-May-1966  REASON FOR VISIT: follow up HISTORY FROM: patient  HISTORY OF PRESENT ILLNESS: Today 06/07/20:  Jonathan [name] is a 54 year old male with a history of nocturnal seizures.  He returns today for evaluation.  He states that on Monday night his wife noticed that he was very agitated while he was sleeping moving about in bed and was making a weird noise.  He states that this has been consistent with previous seizure events.  He states that several days before he did miss a dose of Depakote.  He returns today for an evaluation.   HISTORY Update Mar 07, 2020 SS: Jonathan [name] is a 54 year old male with history of seizure disorder and migraine headache.  He remains on Depakote ER 500 mg, 2 tablets at bedtime. His last seizure was in December 2017, he has only ever had 2 seizures, both nocturnal.  He is self-employed, has a Set designer.  His seizures and migraines are currently under good control. He may occasionally have a flash of light, like an aura, but no headache results.  His overall health is good.  He presents today for evaluation unaccompanied.  REVIEW OF SYSTEMS: Out of a complete 14 system review of symptoms, the patient complains only of the following symptoms, and all other reviewed systems are negative.  See HPi  ALLERGIES: Allergies  Allergen Reactions  . Bee Venom     unknown    HOME MEDICATIONS: Outpatient Medications Prior to Visit  Medication Sig Dispense Refill  . aspirin-acetaminophen-caffeine (EXCEDRIN MIGRAINE) 250-250-65 MG tablet Take 1 tablet by mouth every 6 (six) hours as needed for headache.    . divalproex (DEPAKOTE ER) 500 MG 24 hr tablet Take 2 tablets (1,000 mg total) by mouth daily. 180 tablet 3  . ibuprofen (ADVIL,MOTRIN) 200 MG tablet Take 400 mg by mouth every 6 (six) hours as needed for moderate pain.    Marland Kitchen lisinopril-hydrochlorothiazide (PRINZIDE,ZESTORETIC) 20-25 MG tablet Take 1 tablet by mouth daily.   10   No  facility-administered medications prior to visit.    PAST MEDICAL HISTORY: Past Medical History:  Diagnosis Date  . Bee sting allergy   . HTN (hypertension)   . Migraines   . Seizure (HCC)     PAST SURGICAL HISTORY: Past Surgical History:  Procedure Laterality Date  . NO PAST SURGERIES      FAMILY HISTORY: Family History  Problem Relation Age of Onset  . Hypertension Mother   . Heart attack Father   . Cancer Maternal Grandmother        breast    SOCIAL HISTORY: Social History   Socioeconomic History  . Marital status: Married    Spouse name: [name]  . Number of children: 1  . Years of education: 17  . Highest education level: Not on file  Occupational History  . Occupation: [employer]    Comment: Self-employed  Tobacco Use  . Smoking status: Never Smoker  . Smokeless tobacco: Never Used  Substance and Sexual Activity  . Alcohol use: Yes    Alcohol/week: 0.0 standard drinks    Comment: 5-6 drinks/week  . Drug use: No  . Sexual activity: Not on file  Other Topics Concern  . Not on file  Social History Narrative   Right-handed.   Lives at home with her wife.   2 cups caffeine/day.   One child.   Social Determinants of Health   Financial Resource Strain:   . Difficulty of Paying Living  Expenses:   Food Insecurity:   . Worried About Programme researcher, broadcasting/film/video in the Last Year:   . Barista in the Last Year:   Transportation Needs:   . Freight forwarder (Medical):   Marland Kitchen Lack of Transportation (Non-Medical):   Physical Activity:   . Days of Exercise per Week:   . Minutes of Exercise per Session:   Stress:   . Feeling of Stress :   Social Connections:   . Frequency of Communication with Friends and Family:   . Frequency of Social Gatherings with Friends and Family:   . Attends Religious Services:   . Active Member of Clubs or Organizations:   . Attends Banker Meetings:   Marland Kitchen Marital Status:   Intimate Partner Violence:    . Fear of Current or Ex-Partner:   . Emotionally Abused:   Marland Kitchen Physically Abused:   . Sexually Abused:       PHYSICAL EXAM  Vitals:   06/07/20 0811  BP: (!) 92/54  Pulse: (!) 48  Weight: 144 lb (65.3 kg)  Height: 5\' 8"  (1.727 m)   Body mass index is 21.9 kg/m.  Generalized: Well developed, in no acute distress   Neurological examination  Mentation: Alert oriented to time, place, history taking. Follows all commands speech and language fluent Cranial nerve II-XII: Pupils were equal round reactive to light. Extraocular movements were full, visual field were full on confrontational test.. Head turning and shoulder shrug  were normal and symmetric. Motor: The motor testing reveals 5 over 5 strength of all 4 extremities. Good symmetric motor tone is noted throughout.  Sensory: Sensory testing is intact to soft touch on all 4 extremities. No evidence of extinction is noted.  Coordination: Cerebellar testing reveals good finger-nose-finger and heel-to-shin bilaterally.  Gait and station: Gait is normal.  Reflexes: Deep tendon reflexes are symmetric and normal bilaterally.   DIAGNOSTIC DATA (LABS, IMAGING, TESTING) - I reviewed patient records, labs, notes, testing and imaging myself where available.  Lab Results  Component Value Date   WBC 4.5 03/07/2020   HGB 14.3 03/07/2020   HCT 41.5 03/07/2020   MCV 96 03/07/2020   PLT 121 (L) 03/07/2020      Component Value Date/Time   NA 140 03/07/2020 1325   K 4.0 03/07/2020 1325   CL 100 03/07/2020 1325   CO2 26 03/07/2020 1325   GLUCOSE 88 03/07/2020 1325   GLUCOSE 106 (H) 10/26/2016 2315   BUN 16 03/07/2020 1325   CREATININE 0.91 03/07/2020 1325   CALCIUM 9.6 03/07/2020 1325   PROT 6.8 03/07/2020 1325   ALBUMIN 4.4 03/07/2020 1325   AST 52 (H) 03/07/2020 1325   ALT 33 03/07/2020 1325   ALKPHOS 67 03/07/2020 1325   BILITOT 0.4 03/07/2020 1325   GFRNONAA 97 03/07/2020 1325   GFRAA 112 03/07/2020 1325      ASSESSMENT  AND PLAN 54 y.o. year old male  has a past medical history of Bee sting allergy, HTN (hypertension), Migraines, and Seizure (HCC). here with:  Seizures   Continue Depakote  Cautioned the patient about missing medication.  I will check blood work today.  Platelets counts have been low in the past.  Patient advised that if he has any additional seizure events he should let 44 know  Follow-up in 6 months or sooner if needed   I spent 30 minutes of face-to-face and non-face-to-face time with patient.  This included previsit chart review, lab review, study review,  order entry, electronic health record documentation, patient education.  Butch Penny, MSN, NP-C 06/07/2020, 8:14 AM [facility] [address], [address] [place], [place] [address] [phone]

## 2020-06-07 NOTE — Telephone Encounter (Signed)
Pt has called back with his additional questions he failed to ask during his appointment.  1. Pt would like to discuss concerns about his low blood count. 2. Pt wants to discuss his night time seizure activity and if this could affect his ability to drive within the next 6 months.  Pt also wants to know if as a result of his seizure activity could this cause him to loose consciousness.  Please call (pt aware to allow 24-48 hours for a response )

## 2020-06-07 NOTE — Telephone Encounter (Signed)
I called the patient.  After his visit he realized he had additional questions.  Advised that his low platelet count could be due to the Depakote.  For that reason we are rechecking levels.  Advised that if he had a daytime seizure then we will require him to refrain from driving until he is seizure-free for 6 months.

## 2020-06-08 LAB — CBC WITH DIFFERENTIAL/PLATELET
Basophils Absolute: 0.1 10*3/uL (ref 0.0–0.2)
Basos: 2 %
EOS (ABSOLUTE): 0.2 10*3/uL (ref 0.0–0.4)
Eos: 4 %
Hematocrit: 42.5 % (ref 37.5–51.0)
Hemoglobin: 14.6 g/dL (ref 13.0–17.7)
Immature Grans (Abs): 0 10*3/uL (ref 0.0–0.1)
Immature Granulocytes: 0 %
Lymphocytes Absolute: 1.9 10*3/uL (ref 0.7–3.1)
Lymphs: 36 %
MCH: 34.1 pg — ABNORMAL HIGH (ref 26.6–33.0)
MCHC: 34.4 g/dL (ref 31.5–35.7)
MCV: 99 fL — ABNORMAL HIGH (ref 79–97)
Monocytes Absolute: 1 10*3/uL — ABNORMAL HIGH (ref 0.1–0.9)
Monocytes: 19 %
Neutrophils Absolute: 2.1 10*3/uL (ref 1.4–7.0)
Neutrophils: 39 %
Platelets: 130 10*3/uL — ABNORMAL LOW (ref 150–450)
RBC: 4.28 x10E6/uL (ref 4.14–5.80)
RDW: 11.9 % (ref 11.6–15.4)
WBC: 5.4 10*3/uL (ref 3.4–10.8)

## 2020-06-08 LAB — COMPREHENSIVE METABOLIC PANEL
ALT: 30 IU/L (ref 0–44)
AST: 54 IU/L — ABNORMAL HIGH (ref 0–40)
Albumin/Globulin Ratio: 1.8 (ref 1.2–2.2)
Albumin: 4.5 g/dL (ref 3.8–4.9)
Alkaline Phosphatase: 53 IU/L (ref 48–121)
BUN/Creatinine Ratio: 15 (ref 9–20)
BUN: 15 mg/dL (ref 6–24)
Bilirubin Total: 0.7 mg/dL (ref 0.0–1.2)
CO2: 26 mmol/L (ref 20–29)
Calcium: 9.6 mg/dL (ref 8.7–10.2)
Chloride: 97 mmol/L (ref 96–106)
Creatinine, Ser: 0.99 mg/dL (ref 0.76–1.27)
GFR calc Af Amer: 101 mL/min/{1.73_m2} (ref >59)
GFR calc non Af Amer: 87 mL/min/{1.73_m2} (ref >59)
Globulin, Total: 2.5 g/dL (ref 1.5–4.5)
Glucose: 88 mg/dL (ref 65–99)
Potassium: 4.2 mmol/L (ref 3.5–5.2)
Sodium: 137 mmol/L (ref 134–144)
Total Protein: 7 g/dL (ref 6.0–8.5)

## 2020-06-08 LAB — VALPROIC ACID LEVEL: Valproic Acid Lvl: 66 ug/mL (ref 50–100)

## 2020-06-15 ENCOUNTER — Telehealth: Payer: Self-pay

## 2020-06-15 NOTE — Telephone Encounter (Signed)
Pt verified by name and DOB,  normal results given per provider, pt voiced understanding all question answered. °

## 2020-06-15 NOTE — Telephone Encounter (Signed)
Attempted to call pt, LVM  Ask pt to call back 

## 2020-06-15 NOTE — Telephone Encounter (Signed)
-----   Message from Jonathan Penny, NP sent at 06/13/2020  1:35 PM EDT ----- Platelets have improved.  Patient will remain on current dose of Depakote.  Please encourage the patient to take medication as prescribed and not miss any dosing.  If he has any additional seizure events he should let us know.

## 2020-06-15 NOTE — Telephone Encounter (Signed)
-----   Message from Megan Millikan, NP sent at 06/13/2020  1:35 PM EDT ----- Platelets have improved.  Patient will remain on current dose of Depakote.  Please encourage the patient to take medication as prescribed and not miss any dosing.  If he has any additional seizure events he should let us know. 

## 2020-07-05 ENCOUNTER — Ambulatory Visit: Payer: Self-pay | Admitting: Neurology

## 2020-09-25 NOTE — Progress Notes (Signed)
I have reviewed and agreed above plan. 

## 2020-12-11 ENCOUNTER — Ambulatory Visit: Payer: 59 | Admitting: Adult Health

## 2020-12-11 ENCOUNTER — Encounter: Payer: Self-pay | Admitting: Adult Health

## 2020-12-11 VITALS — BP 118/64 | HR 66 | Ht 68.0 in | Wt 143.0 lb

## 2020-12-11 DIAGNOSIS — R569 Unspecified convulsions: Secondary | ICD-10-CM | POA: Diagnosis not present

## 2020-12-11 DIAGNOSIS — Z5181 Encounter for therapeutic drug level monitoring: Secondary | ICD-10-CM | POA: Diagnosis not present

## 2020-12-11 NOTE — Progress Notes (Signed)
PATIENT: Jonathan [name] DOB: 1965-12-30  REASON FOR VISIT: follow up HISTORY FROM: patient  HISTORY OF PRESENT ILLNESS: Today 12/11/20:  Jonathan [name] is a 55 year old male with a history of nocturnal seizures.  He returns today for follow-up.  He reports he has not had any additional seizure events.  Continues on Depakote 2000 mg daily.  Denies any significant side effects.  No change in mood or behavior.  He is able to complete all ADLs independently.  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  06/07/20: Jonathan [name] is a 55 year old male with a history of nocturnal seizures.  He returns today for evaluation.  He states that on Monday night his wife noticed that he was very agitated while he was sleeping moving about in bed and was making a weird noise.  He states that this has been consistent with previous seizure events.  He states that several days before he did miss a dose of Depakote.  He returns today for an evaluation.   HISTORY Update Mar 07, 2020 SS: Jonathan [name] is a 55 year old male with history of seizure disorder and migraine headache.  He remains on Depakote ER 500 mg, 2 tablets at bedtime. His last seizure was in December 2017, he has only ever had 2 seizures, both nocturnal.  He is self-employed, has a Set designer.  His seizures and migraines are currently under good control. He may occasionally have a flash of light, like an aura, but no headache results.  His overall health is good.  He presents today for evaluation unaccompanied.  REVIEW OF SYSTEMS: Out of a complete 14 system review of symptoms, the patient complains only of the following symptoms, and all other reviewed systems are negative.  See HPi  ALLERGIES: Allergies  Allergen Reactions  . Bee Venom     unknown    HOME MEDICATIONS: Outpatient Medications Prior to Visit  Medication Sig Dispense Refill  . aspirin-acetaminophen-caffeine (EXCEDRIN MIGRAINE) 250-250-65 MG tablet Take 1 tablet by mouth every 6  (six) hours as needed for headache.    . divalproex (DEPAKOTE ER) 500 MG 24 hr tablet Take 2 tablets (1,000 mg total) by mouth daily. 180 tablet 3  . ibuprofen (ADVIL,MOTRIN) 200 MG tablet Take 400 mg by mouth every 6 (six) hours as needed for moderate pain.    Marland Kitchen lisinopril-hydrochlorothiazide (PRINZIDE,ZESTORETIC) 20-25 MG tablet Take 1 tablet by mouth daily.   10   No facility-administered medications prior to visit.    PAST MEDICAL HISTORY: Past Medical History:  Diagnosis Date  . Bee sting allergy   . HTN (hypertension)   . Migraines   . Seizure (HCC)     PAST SURGICAL HISTORY: Past Surgical History:  Procedure Laterality Date  . NO PAST SURGERIES      FAMILY HISTORY: Family History  Problem Relation Age of Onset  . Hypertension Mother   . Heart attack Father   . Cancer Maternal Grandmother        breast    SOCIAL HISTORY: Social History   Socioeconomic History  . Marital status: Married    Spouse name: [name]  . Number of children: 1  . Years of education: 42  . Highest education level: Not on file  Occupational History  . Occupation: [employer]    Comment: Self-employed  Tobacco Use  . Smoking status: Never Smoker  . Smokeless tobacco: Never Used  Substance and Sexual Activity  . Alcohol use: Yes    Alcohol/week: 0.0 standard drinks  Comment: 5-6 drinks/week  . Drug use: No  . Sexual activity: Not on file  Other Topics Concern  . Not on file  Social History Narrative   Right-handed.   Lives at home with her wife.   2 cups caffeine/day.   One child.   Social Determinants of Health   Financial Resource Strain: Not on file  Food Insecurity: Not on file  Transportation Needs: Not on file  Physical Activity: Not on file  Stress: Not on file  Social Connections: Not on file  Intimate Partner Violence: Not on file      PHYSICAL EXAM  Vitals:   12/11/20 0833  BP: 118/64  Pulse: 66  Weight: 143 lb (64.9 kg)  Height: 5\' 8"   (1.727 m)   Body mass index is 21.74 kg/m.  Generalized: Well developed, in no acute distress   Neurological examination  Mentation: Alert oriented to time, place, history taking. Follows all commands speech and language fluent Cranial nerve II-XII: Pupils were equal round reactive to light. Extraocular movements were full, visual field were full on confrontational test.. Head turning and shoulder shrug  were normal and symmetric. Motor: The motor testing reveals 5 over 5 strength of all 4 extremities. Good symmetric motor tone is noted throughout.  Sensory: Sensory testing is intact to soft touch on all 4 extremities. No evidence of extinction is noted.  Coordination: Cerebellar testing reveals good finger-nose-finger and heel-to-shin bilaterally.  Gait and station: Gait is normal.  Reflexes: Deep tendon reflexes are symmetric and normal bilaterally.   DIAGNOSTIC DATA (LABS, IMAGING, TESTING) - I reviewed patient records, labs, notes, testing and imaging myself where available.  Lab Results  Component Value Date   WBC 5.4 06/07/2020   HGB 14.6 06/07/2020   HCT 42.5 06/07/2020   MCV 99 (H) 06/07/2020   PLT 130 (L) 06/07/2020      Component Value Date/Time   NA 137 06/07/2020 0826   K 4.2 06/07/2020 0826   CL 97 06/07/2020 0826   CO2 26 06/07/2020 0826   GLUCOSE 88 06/07/2020 0826   GLUCOSE 106 (H) 10/26/2016 2315   BUN 15 06/07/2020 0826   CREATININE 0.99 06/07/2020 0826   CALCIUM 9.6 06/07/2020 0826   PROT 7.0 06/07/2020 0826   ALBUMIN 4.5 06/07/2020 0826   AST 54 (H) 06/07/2020 0826   ALT 30 06/07/2020 0826   ALKPHOS 53 06/07/2020 0826   BILITOT 0.7 06/07/2020 0826   GFRNONAA 87 06/07/2020 0826   GFRAA 101 06/07/2020 0826      ASSESSMENT AND PLAN 55 y.o. year old male  has a past medical history of Bee sting allergy, HTN (hypertension), Migraines, and Seizure (HCC). here with:  Seizures   Continue Depakote 1000 mg daily  I will check blood work today- CBC.  CMP, Depakote level  Patient advised that if he has any additional seizure events he should let 40 know  Follow-up in 1 year or sooner if needed   I spent 30 minutes of face-to-face and non-face-to-face time with patient.  This included previsit chart review, lab review, study review, order entry, electronic health record documentation, patient education.  [place], MSN, NP-C 12/11/2020, 8:33 AM [facility] [address], [address] [place], [place] [phone]

## 2020-12-11 NOTE — Patient Instructions (Signed)
Continue Depakote 1000 mg daily Blood work today If you have any seizure events please let us know.

## 2020-12-12 LAB — CBC WITH DIFFERENTIAL/PLATELET
Basophils Absolute: 0.1 10*3/uL (ref 0.0–0.2)
Basos: 2 %
EOS (ABSOLUTE): 0.2 10*3/uL (ref 0.0–0.4)
Eos: 4 %
Hematocrit: 43.6 % (ref 37.5–51.0)
Hemoglobin: 14.4 g/dL (ref 13.0–17.7)
Immature Grans (Abs): 0 10*3/uL (ref 0.0–0.1)
Immature Granulocytes: 0 %
Lymphocytes Absolute: 1.2 10*3/uL (ref 0.7–3.1)
Lymphs: 30 %
MCH: 31.8 pg (ref 26.6–33.0)
MCHC: 33 g/dL (ref 31.5–35.7)
MCV: 96 fL (ref 79–97)
Monocytes Absolute: 0.8 10*3/uL (ref 0.1–0.9)
Monocytes: 19 %
Neutrophils Absolute: 1.9 10*3/uL (ref 1.4–7.0)
Neutrophils: 45 %
Platelets: 128 10*3/uL — ABNORMAL LOW (ref 150–450)
RBC: 4.53 x10E6/uL (ref 4.14–5.80)
RDW: 11.9 % (ref 11.6–15.4)
WBC: 4.1 10*3/uL (ref 3.4–10.8)

## 2020-12-12 LAB — COMPREHENSIVE METABOLIC PANEL
ALT: 19 IU/L (ref 0–44)
AST: 26 IU/L (ref 0–40)
Albumin/Globulin Ratio: 1.4 (ref 1.2–2.2)
Albumin: 4.2 g/dL (ref 3.8–4.9)
Alkaline Phosphatase: 70 IU/L (ref 44–121)
BUN/Creatinine Ratio: 15 (ref 9–20)
BUN: 16 mg/dL (ref 6–24)
Bilirubin Total: 0.5 mg/dL (ref 0.0–1.2)
CO2: 30 mmol/L — ABNORMAL HIGH (ref 20–29)
Calcium: 9.7 mg/dL (ref 8.7–10.2)
Chloride: 100 mmol/L (ref 96–106)
Creatinine, Ser: 1.08 mg/dL (ref 0.76–1.27)
GFR calc Af Amer: 90 mL/min/{1.73_m2} (ref >59)
GFR calc non Af Amer: 78 mL/min/{1.73_m2} (ref >59)
Globulin, Total: 2.9 g/dL (ref 1.5–4.5)
Glucose: 71 mg/dL (ref 65–99)
Potassium: 4.1 mmol/L (ref 3.5–5.2)
Sodium: 144 mmol/L (ref 134–144)
Total Protein: 7.1 g/dL (ref 6.0–8.5)

## 2020-12-12 LAB — VALPROIC ACID LEVEL: Valproic Acid Lvl: 65 ug/mL (ref 50–100)

## 2020-12-12 NOTE — Progress Notes (Signed)
Platelet 128, was 130 in August 21, and 121 in May 2021, will continue to monitor

## 2020-12-14 ENCOUNTER — Telehealth: Payer: Self-pay | Admitting: *Deleted

## 2020-12-14 NOTE — Telephone Encounter (Signed)
-----   Message from Butch Penny, NP sent at 12/12/2020  9:42 AM EST ----- Depakote level normal. PLatelets low- have gotten lower over the last year. Will forward to Dr. Terrace Arabia for her review as well.

## 2020-12-14 NOTE — Telephone Encounter (Signed)
Spoke to pt and relayed lab results.  He verbalized understanding.  

## 2021-03-08 ENCOUNTER — Ambulatory Visit: Payer: Self-pay | Admitting: Neurology

## 2021-03-14 ENCOUNTER — Other Ambulatory Visit: Payer: Self-pay | Admitting: Neurology

## 2021-05-30 ENCOUNTER — Other Ambulatory Visit: Payer: Self-pay

## 2021-05-30 ENCOUNTER — Emergency Department (HOSPITAL_COMMUNITY)
Admission: EM | Admit: 2021-05-30 | Discharge: 2021-05-30 | Disposition: A | Discharge status: Home / Self Care | Payer: 59 | Attending: Emergency Medicine | Admitting: Emergency Medicine

## 2021-05-30 ENCOUNTER — Encounter (HOSPITAL_COMMUNITY): Payer: Self-pay | Admitting: Emergency Medicine

## 2021-05-30 DIAGNOSIS — G40509 Epileptic seizures related to external causes, not intractable, without status epilepticus: Secondary | ICD-10-CM | POA: Insufficient documentation

## 2021-05-30 DIAGNOSIS — Z79899 Other long term (current) drug therapy: Secondary | ICD-10-CM | POA: Diagnosis not present

## 2021-05-30 DIAGNOSIS — R569 Unspecified convulsions: Secondary | ICD-10-CM

## 2021-05-30 DIAGNOSIS — I10 Essential (primary) hypertension: Secondary | ICD-10-CM | POA: Insufficient documentation

## 2021-05-30 LAB — BASIC METABOLIC PANEL
Anion gap: 17 — ABNORMAL HIGH (ref 5–15)
BUN: 13 mg/dL (ref 6–20)
CO2: 18 mmol/L — ABNORMAL LOW (ref 22–32)
Calcium: 9.6 mg/dL (ref 8.9–10.3)
Chloride: 99 mmol/L (ref 98–111)
Creatinine, Ser: 1.12 mg/dL (ref 0.61–1.24)
GFR, Estimated: >60 mL/min (ref >60)
Glucose, Bld: 120 mg/dL — ABNORMAL HIGH (ref 70–99)
Potassium: 3.8 mmol/L (ref 3.5–5.1)
Sodium: 134 mmol/L — ABNORMAL LOW (ref 135–145)

## 2021-05-30 LAB — CBC
HCT: 46.5 % (ref 39.0–52.0)
Hemoglobin: 15 g/dL (ref 13.0–17.0)
MCH: 32.3 pg (ref 26.0–34.0)
MCHC: 32.3 g/dL (ref 30.0–36.0)
MCV: 100 fL (ref 80.0–100.0)
Platelets: 112 10*3/uL — ABNORMAL LOW (ref 150–400)
RBC: 4.65 MIL/uL (ref 4.22–5.81)
RDW: 12.1 % (ref 11.5–15.5)
WBC: 5.4 10*3/uL (ref 4.0–10.5)
nRBC: 0 % (ref 0.0–0.2)

## 2021-05-30 LAB — VALPROIC ACID LEVEL: Valproic Acid Lvl: 43 ug/mL — ABNORMAL LOW (ref 50.0–100.0)

## 2021-05-30 MED ORDER — DIVALPROEX SODIUM ER 500 MG PO TB24: 1500.0000 mg | ORAL_TABLET | Freq: Every day | ORAL | 0 refills | Status: DC

## 2021-05-30 MED ORDER — VALPROATE SODIUM 100 MG/ML IV SOLN
600.0000 mg | Freq: Once | INTRAVENOUS | Status: AC
  Administered 2021-05-30: 600 mg via INTRAVENOUS
  Filled 2021-05-30: qty 6

## 2021-05-30 NOTE — ED Triage Notes (Signed)
Per [facility] pt was at restaurant with parents having a witnessed seizure causing him to fall out of chair and hit back of head on concrete. C-collar in place. Bit bottom of lip. Takes Depakote and has not missed any doses. Patient alert and orientated x4 on arrival, does not recall seizure.

## 2021-05-30 NOTE — ED Notes (Signed)
Patient given meal bag. 

## 2021-05-30 NOTE — ED Provider Notes (Signed)
[facility] EMERGENCY DEPARTMENT Provider Note   CSN: [id number] Arrival date & time: 05/30/21  1220     History Chief Complaint  Patient presents with   Seizures    Jonathan [name] is a 55 y.o. male.  HPI Patient has known epileptic seizure history.  He was out having lunch with his parents and the last thing he remembers after that was being in the ambulance.  He reports he has been feeling well up until this episode.  Reportedly he was seated and might have yelled out and then fallen to the floor with seizure activity.  Patient was transported without incident.  On arrival he is asymptomatic.  Patient's wife reports this is not an atypical presentation for his seizures.  She reports however he has not had very many breakthrough seizures and usually they had happened at night.  No changes to the routine recently.  Patient is compliant with medications with maybe slight time variations during the course of the evening.  Yesterday evening he and his wife had entertained friends.  He drinks infrequently but had 2 glasses of wine last night otherwise normal evening.  No other drugs of abuse.  Patient does not smoke.  No marijuana use.  Patient reports he has mild frontal headache behind his eyes but no other symptoms.  He denies severe headache.  He denies neck pain weakness numbness or tingling.  He denies nausea or visual changes.  Reports he did bite his tongue.  He takes Depakote ER 1000 mg every evening.    Past Medical History:  Diagnosis Date   Bee sting allergy    HTN (hypertension)    Migraines    Seizure [facility])     Patient Active Problem List   Diagnosis Date Noted   Encounter for therapeutic drug monitoring 03/18/2017   Seizures (HCC) 12/28/2014   Migraine with aura and without status migrainosus, not intractable 12/28/2014   Right inguinal hernia 06/24/2014    Past Surgical History:  Procedure Laterality Date   NO PAST SURGERIES         Family  History  Problem Relation Age of Onset   Hypertension Mother    Heart attack Father    Cancer Maternal Grandmother        breast    Social History   Tobacco Use   Smoking status: Never   Smokeless tobacco: Never  Substance Use Topics   Alcohol use: Yes    Alcohol/week: 0.0 standard drinks    Comment: 5-6 drinks/week   Drug use: No    Home Medications Prior to Admission medications   Medication Sig Start Date End Date Taking? Authorizing Provider  divalproex (DEPAKOTE ER) 500 MG 24 hr tablet TAKE TWO TABLETS BY MOUTH DAILY Patient taking differently: Take 1,000 mg by mouth at bedtime. 03/14/21  Yes Butch Penny, NP  divalproex (DEPAKOTE ER) 500 MG 24 hr tablet Take 3 tablets (1,500 mg total) by mouth daily. 05/30/21  Yes Elizabeth Paulsen, Lebron Conners, MD  lisinopril-hydrochlorothiazide (PRINZIDE,ZESTORETIC) 20-25 MG tablet Take 0.5 tablets by mouth at bedtime. 09/14/15  Yes [provider]    Allergies    Bee venom  Review of Systems   Review of Systems 10 systems reviewed and negative except as per HPI Physical Exam Updated Vital Signs BP 129/83   Pulse 65   Temp 98.1 F (36.7 C) (Oral)   Resp 15   Ht 5\' 7"  (1.702 m)   Wt 68 kg   SpO2 98%  BMI 23.49 kg/m   Physical Exam Constitutional:      Appearance: Normal appearance.  HENT:     Head: Normocephalic and atraumatic.     Nose: Nose normal.     Mouth/Throat:     Comments: Airway widely patent.  Minor tongue laceration on the left side of the tongue.  No active bleeding Eyes:     Extraocular Movements: Extraocular movements intact.     Conjunctiva/sclera: Conjunctivae normal.     Pupils: Pupils are equal, round, and reactive to light.  Cardiovascular:     Rate and Rhythm: Normal rate and regular rhythm.  Pulmonary:     Effort: Pulmonary effort is normal.     Breath sounds: Normal breath sounds.  Abdominal:     General: There is no distension.     Palpations: Abdomen is soft.     Tenderness: There is no  abdominal tenderness. There is no guarding.  Musculoskeletal:        General: No swelling or tenderness. Normal range of motion.     Cervical back: Neck supple.     Right lower leg: No edema.     Left lower leg: No edema.  Skin:    General: Skin is warm and dry.  Neurological:     General: No focal deficit present.     Mental Status: He is alert and oriented to person, place, and time.     Motor: No weakness.     Coordination: Coordination normal.  Psychiatric:        Mood and Affect: Mood normal.    ED Results / Procedures / Treatments   Labs (all labs ordered are listed, but only abnormal results are displayed) Labs Reviewed  VALPROIC ACID LEVEL - Abnormal; Notable for the following components:      Result Value   Valproic Acid Lvl 43 (*)    All other components within normal limits  BASIC METABOLIC PANEL - Abnormal; Notable for the following components:   Sodium 134 (*)    CO2 18 (*)    Glucose, Bld 120 (*)    Anion gap 17 (*)    All other components within normal limits  CBC - Abnormal; Notable for the following components:   Platelets 112 (*)    All other components within normal limits    EKG None  Radiology No results found.  Procedures Procedures   Medications Ordered in ED Medications  valproate (DEPACON) 600 mg in dextrose 5 % 50 mL IVPB (600 mg Intravenous New Bag/Given 05/30/21 1627)    ED Course  I have reviewed the triage vital signs and the nursing notes.  Pertinent labs & imaging results that were available during my care of the patient were reviewed by me and considered in my medical decision making (see chart for details).    MDM Rules/Calculators/A&P                           Patient has known seizure disorder.  He is compliant with medications.  He had a spontaneous breakthrough seizure this afternoon.  No signs of significant trauma.  Patient has mild frontal headache.  This time I do not feel that CT scan is indicated.  No apparent  triggers.  Patient may have slight variability in timing of Depakote but is compliant.  Consult: Reviewed with Dr. Otelia Limes.  Recommends a 10 mg/kg IV load of Depakote and increasing daily Depakote to 1500 mg with close follow-up  with neurology. Final Clinical Impression(s) / ED Diagnoses Final diagnoses:  Seizure (HCC)    Rx / DC Orders ED Discharge Orders          Ordered    divalproex (DEPAKOTE ER) 500 MG 24 hr tablet  Daily        05/30/21 1634             Arby Barrette, MD 05/30/21 1639

## 2021-05-30 NOTE — Discharge Instructions (Signed)
1.  Increase your evening Depakote dose to 3 tablets, 1500 mg. 2.  Call your neurologist tomorrow to discuss this medication change and your breakthrough seizure.  You need a follow-up recheck within 2 weeks. 3.  Return to the emergency department if you develop any new or concerning symptoms.

## 2021-05-31 ENCOUNTER — Telehealth: Payer: Self-pay | Admitting: Adult Health

## 2021-05-31 DIAGNOSIS — R569 Unspecified convulsions: Secondary | ICD-10-CM

## 2021-05-31 NOTE — Telephone Encounter (Signed)
Pt has called to report that on yesterday he had a seizure, he states he went to the hospital.  Pt would like a call to discuss.

## 2021-05-31 NOTE — Telephone Encounter (Signed)
Called pt then also spoke to wife.  Pt was seen in ED for breakthru seizure.  Level 43.  Had taken 1-2 hours later then normal, Had 2 glasses/wine and one beer (back yard gathering with family). He had been on depakote ER 1000mg  po qhs. They increased to 1500mg  po qhs after giving IV bolus.   ? Keep on this dose, I relayed YES, also ? Repeating level at some point.  Megan NP is out, please advise,.

## 2021-06-04 NOTE — Addendum Note (Signed)
Addended by: Guy Begin on: 06/04/2021 10:48 AM   Modules accepted: Orders

## 2021-06-04 NOTE — Telephone Encounter (Addendum)
I called pt and relayed the message from Dr. Pearlean Brownie.  Keep on depakote 1500mg  po qhs.  He will come in Friday  06-08-21 for 0900 valproic level.  (Normally not here on Fridays (only here till 1200).  Relayed to wife as well.  Reiterated about alcohol intake and lowers sz threshold. They verbalized understanding.   No seizures since last.

## 2021-06-08 ENCOUNTER — Other Ambulatory Visit: Payer: Self-pay

## 2021-06-11 ENCOUNTER — Other Ambulatory Visit: Payer: Self-pay

## 2021-06-11 ENCOUNTER — Other Ambulatory Visit (INDEPENDENT_AMBULATORY_CARE_PROVIDER_SITE_OTHER): Payer: Self-pay

## 2021-06-11 DIAGNOSIS — Z0289 Encounter for other administrative examinations: Secondary | ICD-10-CM

## 2021-06-11 DIAGNOSIS — R569 Unspecified convulsions: Secondary | ICD-10-CM

## 2021-06-12 LAB — VALPROIC ACID LEVEL: Valproic Acid Lvl: 107 ug/mL — ABNORMAL HIGH (ref 50–100)

## 2021-06-13 ENCOUNTER — Telehealth: Payer: Self-pay | Admitting: Neurology

## 2021-06-13 NOTE — Telephone Encounter (Signed)
Patient was given Depacon infusion 600 mg on May 30, 2021 following ER presentation for recurrent seizure  Now on Depakote maintenance dose ER 500 mg 300 mg every night, instead of previous 2 tablets every night) primarily through May 30, 2021  Depakote level is 107, on June 11, 2021, likely is not a trough level  Please double check to see if patient has any signs of toxicity such as extreme dizziness, fatigue, also make sure he does not have recurrent seizure, otherwise keep current medications

## 2021-06-13 NOTE — Telephone Encounter (Signed)
I called the patient to discuss his lab findings. No seizure activity reported. Denies any symptoms of extreme dizziness or fatigue. He took his medication around 9am the evening before having labs drawn at 8:30am the following morning. He did mention having wine in the evenings while we were on the phone, normally only one glass. He verbalized understanding that alcohol can lower the seizure threshold. He will contact our office to notify us of any seizure events.

## 2021-07-24 ENCOUNTER — Telehealth: Payer: Self-pay | Admitting: Adult Health

## 2021-07-24 MED ORDER — DIVALPROEX SODIUM ER 500 MG PO TB24: 1500.0000 mg | ORAL_TABLET | Freq: Every day | ORAL | 4 refills | Status: DC

## 2021-07-24 NOTE — Telephone Encounter (Signed)
Pt called requesting refill for divalproex (DEPAKOTE ER) 500 MG 24 hr tablet. Pharmacy [facility] [id number].

## 2021-07-24 NOTE — Telephone Encounter (Signed)
Rx refill sent.

## 2021-09-25 ENCOUNTER — Telehealth: Payer: Self-pay | Admitting: Adult Health

## 2021-09-25 DIAGNOSIS — R569 Unspecified convulsions: Secondary | ICD-10-CM

## 2021-09-25 NOTE — Telephone Encounter (Signed)
Spoke to pt and he had seizure last night around 11:30pm last night. No triggers that he can think of. Has been compliant with taking depakote er 1500mg  po qhs.  He stated he felt a little draggy, his son has had flu 74Yrs old).  He is not running fever, or having sore throat or UR sx.  He may be a little dehyrated.  Did have a beer at dinner (he does this 3-4 x week). Is aware of alcohol being trigger.   Has appt 12/2021 annual.  Also asked if ok to have flu and shingles vaccine ?  Will forward to MM/NP to advise then will call back.

## 2021-09-25 NOTE — Telephone Encounter (Signed)
Pt called, last night at 11:30 pm had a seizure, lasted 2 to 3 minutes, then had a foggy period. Would like a call from the nurse.

## 2021-09-25 NOTE — Telephone Encounter (Signed)
I spoke to pt and he will come in tomorrow for labs.  Appt for 0915 was made 10-05-21, no driving for 6 months, (although most of his sz have been later, and he was told could drive).  Wife has a lot of questions.  She will address when he come in for appt.

## 2021-09-26 ENCOUNTER — Other Ambulatory Visit: Payer: Self-pay

## 2021-09-26 ENCOUNTER — Ambulatory Visit
Admission: EM | Admit: 2021-09-26 | Discharge: 2021-09-26 | Disposition: A | Discharge status: Home / Self Care | Payer: 59 | Attending: Emergency Medicine | Admitting: Emergency Medicine

## 2021-09-26 ENCOUNTER — Other Ambulatory Visit (INDEPENDENT_AMBULATORY_CARE_PROVIDER_SITE_OTHER): Payer: Self-pay

## 2021-09-26 ENCOUNTER — Telehealth (HOSPITAL_COMMUNITY): Payer: Self-pay | Admitting: Emergency Medicine

## 2021-09-26 DIAGNOSIS — R569 Unspecified convulsions: Secondary | ICD-10-CM

## 2021-09-26 DIAGNOSIS — Z0289 Encounter for other administrative examinations: Secondary | ICD-10-CM

## 2021-09-26 DIAGNOSIS — R509 Fever, unspecified: Secondary | ICD-10-CM

## 2021-09-26 DIAGNOSIS — Z20828 Contact with and (suspected) exposure to other viral communicable diseases: Secondary | ICD-10-CM

## 2021-09-26 LAB — POCT INFLUENZA A/B
Influenza A, POC: POSITIVE — AB
Influenza B, POC: NEGATIVE

## 2021-09-26 MED ORDER — ACETAMINOPHEN 325 MG PO TABS
975.0000 mg | ORAL_TABLET | Freq: Once | ORAL | Status: AC
Start: 2021-09-26 — End: 2021-09-26
  Administered 2021-09-26: 975 mg via ORAL

## 2021-09-26 MED ORDER — OSELTAMIVIR PHOSPHATE 75 MG PO CAPS: 75.0000 mg | ORAL_CAPSULE | Freq: Two times a day (BID) | ORAL | 0 refills | Status: DC

## 2021-09-26 NOTE — Discharge Instructions (Addendum)
Your symptoms are most consistent with a viral upper respiratory illness.  Your influenza test today was positive.  I provided you with a prescription for Tamiflu to begin now.  Conservative care is recommended at this time.  This includes rest, pushing clear fluids and activity as tolerated.  You may also noticed that your appetite is reduced, this is okay as long as they are drinking plenty of clear fluids.  Acetaminophen (Tylenol): This is a good fever reducer.  If there body temperature rises above 101.5 as measured with a thermometer, it is recommended that you give them 1,000 mg every 6-8 hours until they are temperature falls below 101.5, please not take more than 3,000 mg of acetaminophen either as a separate medication or as in ingredient in an over-the-counter cold/flu preparation within a 24-hour period  Ibuprofen  (Advil, Motrin): This is a good anti-inflammatory medication which addresses aches and pains and, to some degree, congestion in the nasal passages.  I recommend giving between 400 to 600 mg every 6-8 hours as needed.  Pseudoephedrine (Sudafed): This is a decongestant.  This medication has to be purchased from the pharmacist counter, I recommend giving 2 tablets, 60 mg, 2-3 times a day as needed to relieve runny nose and sinus drainage.  Guaifenesin (Robitussin, Mucinex): This is an expectorant.  This helps break up chest congestion and loosen up thick nasal drainage making phlegm and drainage more liquid and therefore easier to remove.  I recommend being 400 mg three times daily as needed.  Dextromethorphan (any cough medicine with the letters "DM" added to it's name such as Robitussin DM): This is a cough suppressant.  This is often recommended to be taken at nighttime to suppress cough and help children sleep.  Give dosage as directed on the bottle.   Chloraseptic Throat Spray: Spray 5 sprays into affected area every 2 hours, hold for 15 seconds and either swallow or spit it  out.  This is a excellent numbing medication because it is a spray, you can put it right where you needed and so sucking on a lozenge and numbing your entire mouth.  Based on my physical exam findings and the history provided  today, I do not see any evidence of bacterial infection therefore treatment with antibiotics would be of no benefit.  Please follow-up within the next 3 to 5 days either with your primary care provider or urgent care if your symptoms do not resolve.  If you do not have a primary care provider, we will assist you in finding one.

## 2021-09-26 NOTE — ED Provider Notes (Signed)
[facility]    CSN: [id number] Arrival date & time: 09/26/21  6837    HISTORY  No chief complaint on file.  HPI Jonathan [name] is a 55 y.o. male. Patient states 2 nights ago he began to have a sore throat and body ache.  Patient reports having a history of epilepsy, currently on Depakote for this, states that he had a seizure in the middle of the night.  Patient is unaware whether he had a fever or not.  Patient states he woke up with a fever in the morning, contact his neurologist to let them know you have a seizure, had his Depakote levels checked today.  Patient states he is here to find out what is causing him to have sore throat, body ache and fever.  Patient had a temperature of 102.8 on arrival today.  Patient was treated with acetaminophen which brought his temperature down to 100 degrees after 30 minutes.  Patient states that last week, his son was home from school with influenza.  The history is provided by the patient.  Past Medical History:  Diagnosis Date   Bee sting allergy    HTN (hypertension)    Migraines    Seizure [facility])    Patient Active Problem List   Diagnosis Date Noted   Encounter for therapeutic drug monitoring 03/18/2017   Seizures (HCC) 12/28/2014   Migraine with aura and without status migrainosus, not intractable 12/28/2014   Right inguinal hernia 06/24/2014   Past Surgical History:  Procedure Laterality Date   NO PAST SURGERIES      Home Medications    Prior to Admission medications   Medication Sig Start Date End Date Taking? Authorizing Provider  oseltamivir (TAMIFLU) 75 MG capsule Take 1 capsule (75 mg total) by mouth every 12 (twelve) hours. 09/26/21  Yes Theadora Rama Scales, PA-C  divalproex (DEPAKOTE ER) 500 MG 24 hr tablet Take 3 tablets (1,500 mg total) by mouth daily. 07/24/21   Levert Feinstein, MD  lisinopril-hydrochlorothiazide (PRINZIDE,ZESTORETIC) 20-25 MG tablet Take 0.5 tablets by mouth at bedtime. 09/14/15   [provider]   Family History Family History  Problem Relation Age of Onset   Hypertension Mother    Heart attack Father    Cancer Maternal Grandmother        breast   Social History Social History   Tobacco Use   Smoking status: Never   Smokeless tobacco: Never  Substance Use Topics   Alcohol use: Yes    Alcohol/week: 0.0 standard drinks    Comment: 5-6 drinks/week   Drug use: No   Allergies   Bee venom  Review of Systems Review of Systems Pertinent findings noted in history of present illness.   Physical Exam Triage Vital Signs ED Triage Vitals  Enc Vitals Group     BP 08/31/21 0827 (!) 147/82     Pulse Rate 08/31/21 0827 72     Resp 08/31/21 0827 18     Temp 08/31/21 0827 98.3 F (36.8 C)     Temp Source 08/31/21 0827 Oral     SpO2 08/31/21 0827 98 %     Weight --      Height --      Head Circumference --      Peak Flow --      Pain Score 08/31/21 0826 5     Pain Loc --      Pain Edu? --      Excl. in GC? --  No data found.  Updated Vital Signs BP 127/78 (BP Location: Right Arm)   Pulse 96   Temp 100 F (37.8 C)   Resp 18   SpO2 97%   Physical Exam Vitals and nursing note reviewed.  Constitutional:      General: He is not in acute distress.    Appearance: Normal appearance. He is not ill-appearing.  HENT:     Head: Normocephalic and atraumatic.     Salivary Glands: Right salivary gland is not diffusely enlarged or tender. Left salivary gland is not diffusely enlarged or tender.     Right Ear: Tympanic membrane, ear canal and external ear normal. No drainage. No middle ear effusion. There is no impacted cerumen. Tympanic membrane is not erythematous or bulging.     Left Ear: Tympanic membrane, ear canal and external ear normal. No drainage.  No middle ear effusion. There is no impacted cerumen. Tympanic membrane is not erythematous or bulging.     Nose: Nose normal. No nasal deformity, septal deviation, mucosal edema, congestion or rhinorrhea.      Right Turbinates: Not enlarged, swollen or pale.     Left Turbinates: Not enlarged, swollen or pale.     Right Sinus: No maxillary sinus tenderness or frontal sinus tenderness.     Left Sinus: No maxillary sinus tenderness or frontal sinus tenderness.     Mouth/Throat:     Lips: Pink. No lesions.     Mouth: Mucous membranes are moist. No oral lesions.     Pharynx: Uvula midline. Pharyngeal swelling, posterior oropharyngeal erythema and uvula swelling present.     Tonsils: No tonsillar exudate. 2+ on the right. 2+ on the left.  Eyes:     General: Lids are normal.        Right eye: No discharge.        Left eye: No discharge.     Extraocular Movements: Extraocular movements intact.     Conjunctiva/sclera: Conjunctivae normal.     Right eye: Right conjunctiva is not injected.     Left eye: Left conjunctiva is not injected.  Neck:     Trachea: Trachea and phonation normal.  Cardiovascular:     Rate and Rhythm: Normal rate and regular rhythm.     Pulses: Normal pulses.     Heart sounds: Normal heart sounds. No murmur heard.   No friction rub. No gallop.  Pulmonary:     Effort: Pulmonary effort is normal. No accessory muscle usage, prolonged expiration or respiratory distress.     Breath sounds: Normal breath sounds. No stridor, decreased air movement or transmitted upper airway sounds. No decreased breath sounds, wheezing, rhonchi or rales.  Chest:     Chest wall: No tenderness.  Musculoskeletal:        General: Normal range of motion.     Cervical back: Normal range of motion and neck supple. Normal range of motion.  Lymphadenopathy:     Cervical: No cervical adenopathy.  Skin:    General: Skin is warm and dry.     Findings: No erythema or rash.  Neurological:     General: No focal deficit present.     Mental Status: He is alert and oriented to person, place, and time.  Psychiatric:        Mood and Affect: Mood normal.        Behavior: Behavior normal.    Visual  Acuity Right Eye Distance:   Left Eye Distance:   Bilateral Distance:  Right Eye Near:   Left Eye Near:    Bilateral Near:     UC Couse / Diagnostics / Procedures:    EKG  Radiology No results found.  Procedures Procedures (including critical care time)  UC Diagnoses / Final Clinical Impressions(s)    Final diagnoses:  Fever, unspecified  Exposure to influenza   I have reviewed the triage vital signs and the nursing notes.  Pertinent labs & imaging results that were available during my care of the patient were reviewed by me and considered in my medical decision making (see chart for details).    Patient presents today with symptoms consistent with viral upper respiratory infection.  Rapid influenza test today was positive  Patient was provided with a prescription for Tamiflu to begin taking now.  Conservative care also recommended.  Return precautions advised.  Patient/parent/caregiver verbalized understanding and agreement of plan as discussed.  All questions were addressed during visit.  Please see discharge instructions below for further details of plan.  ED Prescriptions     Medication Sig Dispense Auth. Provider   oseltamivir (TAMIFLU) 75 MG capsule Take 1 capsule (75 mg total) by mouth every 12 (twelve) hours. 10 capsule Theadora Rama Scales, PA-C      PDMP not reviewed this encounter.  Pending results:  Labs Reviewed  POCT INFLUENZA A/B - Abnormal; Notable for the following components:      Result Value   Influenza A, POC Positive (*)    All other components within normal limits     Medications Ordered in UC: Medications  acetaminophen (TYLENOL) tablet 975 mg (975 mg Oral Given 09/26/21 1104)    Discharge Instructions:   Discharge Instructions      Your symptoms are most consistent with a viral upper respiratory illness.  Your influenza test today was positive.  I provided you with a prescription for Tamiflu to begin now.  Conservative  care is recommended at this time.  This includes rest, pushing clear fluids and activity as tolerated.  You may also noticed that your appetite is reduced, this is okay as long as they are drinking plenty of clear fluids.  Acetaminophen (Tylenol): This is a good fever reducer.  If there body temperature rises above 101.5 as measured with a thermometer, it is recommended that you give them 1,000 mg every 6-8 hours until they are temperature falls below 101.5, please not take more than 3,000 mg of acetaminophen either as a separate medication or as in ingredient in an over-the-counter cold/flu preparation within a 24-hour period  Ibuprofen  (Advil, Motrin): This is a good anti-inflammatory medication which addresses aches and pains and, to some degree, congestion in the nasal passages.  I recommend giving between 400 to 600 mg every 6-8 hours as needed.  Pseudoephedrine (Sudafed): This is a decongestant.  This medication has to be purchased from the pharmacist counter, I recommend giving 2 tablets, 60 mg, 2-3 times a day as needed to relieve runny nose and sinus drainage.  Guaifenesin (Robitussin, Mucinex): This is an expectorant.  This helps break up chest congestion and loosen up thick nasal drainage making phlegm and drainage more liquid and therefore easier to remove.  I recommend being 400 mg three times daily as needed.  Dextromethorphan (any cough medicine with the letters "DM" added to it's name such as Robitussin DM): This is a cough suppressant.  This is often recommended to be taken at nighttime to suppress cough and help children sleep.  Give dosage as directed on  the bottle.   Chloraseptic Throat Spray: Spray 5 sprays into affected area every 2 hours, hold for 15 seconds and either swallow or spit it out.  This is a excellent numbing medication because it is a spray, you can put it right where you needed and so sucking on a lozenge and numbing your entire mouth.  Based on my physical exam  findings and the history provided  today, I do not see any evidence of bacterial infection therefore treatment with antibiotics would be of no benefit.  Please follow-up within the next 3 to 5 days either with your primary care provider or urgent care if your symptoms do not resolve.  If you do not have a primary care provider, we will assist you in finding one.     Disposition Upon Discharge:  Patient presented with an acute illness with associated systemic symptoms and significant discomfort requiring urgent management. In my opinion, this is a condition that a prudent lay person (someone who possesses an average knowledge of health and medicine) may potentially expect to result in complications if not addressed urgently such as respiratory distress, impairment of bodily function or dysfunction of bodily organs.   Routine symptom specific, illness specific and/or disease specific instructions were discussed with the patient and/or caregiver at length.   As such, the patient has been evaluated and assessed, work-up was performed and treatment was provided in alignment with urgent care protocols and evidence based medicine.  Patient/parent/caregiver has been advised that the patient may require follow up for further testing and treatment if the symptoms continue in spite of treatment, as clinically indicated and appropriate.  The patient was tested for COVID-19, Influenza and/or RSV, then the patient/parent/guardian was advised to isolate at home pending the results of his/her diagnostic coronavirus test and potentially longer if they're positive. I have also advised pt that if his/her COVID-19 test returns positive, it's recommended to self-isolate for at least 10 days after symptoms first appeared AND until fever-free for 24 hours without fever reducer AND other symptoms have improved or resolved. Discussed self-isolation recommendations as well as instructions for household member/close contacts as  per the [facility] and [place] [facility], and also gave patient the COVID packet with this information.  Patient/parent/caregiver has been advised to return to the [facility] or PCP in 3-5 days if no better; to PCP or the Emergency Department if new signs and symptoms develop, or if the current signs or symptoms continue to change or worsen for further workup, evaluation and treatment as clinically indicated and appropriate  The patient will follow up with their current PCP if and as advised. If the patient does not currently have a PCP we will assist them in obtaining one.   The patient may need specialty follow up if the symptoms continue, in spite of conservative treatment and management, for further workup, evaluation, consultation and treatment as clinically indicated and appropriate.  Condition: stable for discharge home Home: take medications as prescribed; routine discharge instructions as discussed; follow up as advised.    Theadora Rama Scales, PA-C 09/26/21 1310

## 2021-09-26 NOTE — ED Triage Notes (Signed)
Pt stats he had a sore throat, and body aches. Pt states he has epilepsy and had a seizure Monday night, pt denies changes in vision and denies hitting his head (he states it happened while in bed).  Started: last night

## 2021-09-26 NOTE — Telephone Encounter (Signed)
Reviewed positive flu with patient and wife, all questions answered

## 2021-09-27 LAB — COMPREHENSIVE METABOLIC PANEL
ALT: 63 IU/L — ABNORMAL HIGH (ref 0–44)
AST: 100 IU/L — ABNORMAL HIGH (ref 0–40)
Albumin/Globulin Ratio: 1.7 (ref 1.2–2.2)
Albumin: 4.5 g/dL (ref 3.8–4.9)
Alkaline Phosphatase: 60 IU/L (ref 44–121)
BUN/Creatinine Ratio: 11 (ref 9–20)
BUN: 14 mg/dL (ref 6–24)
Bilirubin Total: 0.5 mg/dL (ref 0.0–1.2)
CO2: 28 mmol/L (ref 20–29)
Calcium: 9.8 mg/dL (ref 8.7–10.2)
Chloride: 93 mmol/L — ABNORMAL LOW (ref 96–106)
Creatinine, Ser: 1.22 mg/dL (ref 0.76–1.27)
Globulin, Total: 2.7 g/dL (ref 1.5–4.5)
Glucose: 97 mg/dL (ref 70–99)
Potassium: 4.4 mmol/L (ref 3.5–5.2)
Sodium: 136 mmol/L (ref 134–144)
Total Protein: 7.2 g/dL (ref 6.0–8.5)
eGFR: 70 mL/min/{1.73_m2} (ref >59)

## 2021-09-27 LAB — CBC
Hematocrit: 44.2 % (ref 37.5–51.0)
Hemoglobin: 14.9 g/dL (ref 13.0–17.7)
MCH: 33 pg (ref 26.6–33.0)
MCHC: 33.7 g/dL (ref 31.5–35.7)
MCV: 98 fL — ABNORMAL HIGH (ref 79–97)
Platelets: 101 10*3/uL — ABNORMAL LOW (ref 150–450)
RBC: 4.52 x10E6/uL (ref 4.14–5.80)
RDW: 11.9 % (ref 11.6–15.4)
WBC: 7.1 10*3/uL (ref 3.4–10.8)

## 2021-09-27 LAB — VALPROIC ACID LEVEL: Valproic Acid Lvl: 110 ug/mL — ABNORMAL HIGH (ref 50–100)

## 2021-10-01 ENCOUNTER — Telehealth: Payer: Self-pay | Admitting: Adult Health

## 2021-10-01 ENCOUNTER — Other Ambulatory Visit (INDEPENDENT_AMBULATORY_CARE_PROVIDER_SITE_OTHER): Payer: Self-pay

## 2021-10-01 DIAGNOSIS — Z0289 Encounter for other administrative examinations: Secondary | ICD-10-CM

## 2021-10-01 DIAGNOSIS — R569 Unspecified convulsions: Secondary | ICD-10-CM

## 2021-10-01 NOTE — Telephone Encounter (Signed)
Spoke with both pt and his wife and discussed message from [place] NP. Pt will come today around 430 PM to check his level. He takes his medication around 9 PM at night. Their questions were answered. Towana Badger.

## 2021-10-01 NOTE — Telephone Encounter (Signed)
Please call patient and let him know that his blood work showed an elevated Depakote level his AST and ALT was also elevated.  I would like to recheck blood work.  Orders have been placed he can come in this week to have blood work

## 2021-10-02 LAB — COMPREHENSIVE METABOLIC PANEL
ALT: 55 IU/L — ABNORMAL HIGH (ref 0–44)
AST: 69 IU/L — ABNORMAL HIGH (ref 0–40)
Albumin/Globulin Ratio: 1.5 (ref 1.2–2.2)
Albumin: 4.5 g/dL (ref 3.8–4.9)
Alkaline Phosphatase: 67 IU/L (ref 44–121)
BUN/Creatinine Ratio: 16 (ref 9–20)
BUN: 19 mg/dL (ref 6–24)
Bilirubin Total: 0.4 mg/dL (ref 0.0–1.2)
CO2: 29 mmol/L (ref 20–29)
Calcium: 9.9 mg/dL (ref 8.7–10.2)
Chloride: 97 mmol/L (ref 96–106)
Creatinine, Ser: 1.17 mg/dL (ref 0.76–1.27)
Globulin, Total: 3 g/dL (ref 1.5–4.5)
Glucose: 90 mg/dL (ref 70–99)
Potassium: 4.1 mmol/L (ref 3.5–5.2)
Sodium: 140 mmol/L (ref 134–144)
Total Protein: 7.5 g/dL (ref 6.0–8.5)
eGFR: 74 mL/min/{1.73_m2} (ref >59)

## 2021-10-02 LAB — VALPROIC ACID LEVEL: Valproic Acid Lvl: 70 ug/mL (ref 50–100)

## 2021-10-02 NOTE — Telephone Encounter (Signed)
Pt called states he had some questions about his results and wanted to discuss more about the results. Pt requesting a call back.

## 2021-10-02 NOTE — Telephone Encounter (Signed)
I called the pt and advised per MM NP his Depakote level is back to normal. AST and ALT are improved but slightly abnormal. She will make no changes right now and will discuss more at his office visit this Friday 12/2 at 930 AM. Pt verbalized understanding and his questions were answered.

## 2021-10-05 ENCOUNTER — Ambulatory Visit (INDEPENDENT_AMBULATORY_CARE_PROVIDER_SITE_OTHER): Payer: 59 | Admitting: Adult Health

## 2021-10-05 ENCOUNTER — Encounter: Payer: Self-pay | Admitting: Adult Health

## 2021-10-05 VITALS — BP 109/58 | HR 67 | Ht 67.0 in | Wt 144.4 lb

## 2021-10-05 DIAGNOSIS — R569 Unspecified convulsions: Secondary | ICD-10-CM

## 2021-10-05 DIAGNOSIS — R7989 Other specified abnormal findings of blood chemistry: Secondary | ICD-10-CM

## 2021-10-05 NOTE — Progress Notes (Signed)
PATIENT: Jonathan [name] DOB: [birth year]  REASON FOR VISIT: follow up HISTORY FROM: patient  HISTORY OF PRESENT ILLNESS: Today 10/05/21:  Jonathan [name] is a 55 year old male with a history of nocturnal seizures.  He returns today for follow-up.  He reports that he had a seizure event on November 22.  His wife is with him today.  She reports that f that night she noticed that he was grinding his teeth moaning and frothing at the mouth.  She states that he was stiff this lasted what she thinks was several minutes but she did not officially time him.  She states that she did wake him up and he was a little confused at first but that improved.  The patient reports that he did have a beer he thinks it was only 1 beer before bedtime.  His grandson was diagnosed with the flu on Saturday.  The patient reports that he was diagnosed with the flu a day and a half after having his seizure event.  He is feeling much better today.  He is currently on Depakote 1500 mg daily.  There was some discrepancy in his chart that he was taking 2000 mg daily.  However he clarifies that he is only taken 1500 mg daily.  Blood work done earlier this week showed elevated LFTs.  However Depakote level was back in normal range.  03/07/20: Jonathan [name] is a 55 year old male with a history of nocturnal seizures.  He returns today for follow-up.  He reports he has not had any additional seizure events.  Continues on Depakote 2000 mg daily.  Denies any significant side effects.  No change in mood or behavior.  He is able to complete all ADLs independently.  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  06/07/20: Jonathan [name] is a 55 year old male with a history of nocturnal seizures.  He returns today for evaluation.  He states that on Monday night his wife noticed that he was very agitated while he was sleeping moving about in bed and was making a weird noise.  He states that this has been consistent with previous seizure events.  He  states that several days before he did miss a dose of Depakote.  He returns today for an evaluation.   HISTORY Update Mar 07, 2020 SS: Jonathan [name] is a 55 year old male with history of seizure disorder and migraine headache.  He remains on Depakote ER 500 mg, 2 tablets at bedtime. His last seizure was in December 2017, he has only ever had 2 seizures, both nocturnal.  He is self-employed, has a Set designer.  His seizures and migraines are currently under good control. He may occasionally have a flash of light, like an aura, but no headache results.  His overall health is good.  He presents today for evaluation unaccompanied.  REVIEW OF SYSTEMS: Out of a complete 14 system review of symptoms, the patient complains only of the following symptoms, and all other reviewed systems are negative.  See HPi  ALLERGIES: Allergies  Allergen Reactions   Bee Venom Other (See Comments)    Childhood - hospitalization    HOME MEDICATIONS: Outpatient Medications Prior to Visit  Medication Sig Dispense Refill   divalproex (DEPAKOTE ER) 500 MG 24 hr tablet Take 3 tablets (1,500 mg total) by mouth daily. 90 tablet 4   lisinopril-hydrochlorothiazide (PRINZIDE,ZESTORETIC) 20-25 MG tablet Take 0.5 tablets by mouth at bedtime.  10   oseltamivir (TAMIFLU) 75 MG capsule Take 1 capsule (75 mg total) by  mouth every 12 (twelve) hours. 10 capsule 0   No facility-administered medications prior to visit.    PAST MEDICAL HISTORY: Past Medical History:  Diagnosis Date   Bee sting allergy    HTN (hypertension)    Migraines    Seizure (HCC)     PAST SURGICAL HISTORY: Past Surgical History:  Procedure Laterality Date   NO PAST SURGERIES      FAMILY HISTORY: Family History  Problem Relation Age of Onset   Hypertension Mother    Heart attack Father    Cancer Maternal Grandmother        breast    SOCIAL HISTORY: Social History   Socioeconomic History   Marital status: Married    Spouse name: [name]    Number of children: 1   Years of education: 16   Highest education level: Not on file  Occupational History   Occupation: [employer]    Comment: Self-employed  Tobacco Use   Smoking status: Never   Smokeless tobacco: Never  Substance and Sexual Activity   Alcohol use: Yes    Alcohol/week: 0.0 standard drinks    Comment: 5-6 drinks/week   Drug use: No   Sexual activity: Not on file  Other Topics Concern   Not on file  Social History Narrative   Right-handed.   Lives at home with her wife.   2 cups caffeine/day.   One child.   Social Determinants of Health   Financial Resource Strain: Not on file  Food Insecurity: Not on file  Transportation Needs: Not on file  Physical Activity: Not on file  Stress: Not on file  Social Connections: Not on file  Intimate Partner Violence: Not on file      PHYSICAL EXAM  Vitals:   10/05/21 0929  BP: (!) 109/58  Pulse: 67  Weight: 144 lb 6.4 oz (65.5 kg)  Height: 5\' 7"  (1.702 m)   Body mass index is 22.62 kg/m.  Generalized: Well developed, in no acute distress   Neurological examination  Mentation: Alert oriented to time, place, history taking. Follows all commands speech and language fluent Cranial nerve II-XII: Pupils were equal round reactive to light. Extraocular movements were full, visual field were full on confrontational test.. Head turning and shoulder shrug  were normal and symmetric. Motor: The motor testing reveals 5 over 5 strength of all 4 extremities. Good symmetric motor tone is noted throughout.  Sensory: Sensory testing is intact to soft touch on all 4 extremities. No evidence of extinction is noted.  Coordination: Cerebellar testing reveals good finger-nose-finger and heel-to-shin bilaterally.  Gait and station: Gait is normal.  Reflexes: Deep tendon reflexes are symmetric and normal bilaterally.   DIAGNOSTIC DATA (LABS, IMAGING, TESTING) - I reviewed patient records, labs, notes, testing and  imaging myself where available.  Lab Results  Component Value Date   WBC 7.1 09/26/2021   HGB 14.9 09/26/2021   HCT 44.2 09/26/2021   MCV 98 (H) 09/26/2021   PLT 101 (L) 09/26/2021      Component Value Date/Time   NA 140 10/01/2021 1641   K 4.1 10/01/2021 1641   CL 97 10/01/2021 1641   CO2 29 10/01/2021 1641   GLUCOSE 90 10/01/2021 1641   GLUCOSE 120 (H) 05/30/2021 1230   BUN 19 10/01/2021 1641   CREATININE 1.17 10/01/2021 1641   CALCIUM 9.9 10/01/2021 1641   PROT 7.5 10/01/2021 1641   ALBUMIN 4.5 10/01/2021 1641   AST 69 (H) 10/01/2021 1641   ALT 55 (  H) 10/01/2021 1641   ALKPHOS 67 10/01/2021 1641   BILITOT 0.4 10/01/2021 1641   GFRNONAA >60 05/30/2021 1230   GFRAA 90 12/11/2020 0855      ASSESSMENT AND PLAN 55 y.o. year old male  has a past medical history of Bee sting allergy, HTN (hypertension), Migraines, and Seizure (HCC). here with:  Seizures  Continue Depakote  ER 1500 mg daily I will check blood work today- CMP EEG ordered Discussed driving restrictions with Dr. Terrace Arabia. Per  law the patient should be seizure free for 6 months before driving.  Patient advised that if he has any additional seizure events he should let us know Keep appt in February   I spent 45 minutes of face-to-face and non-face-to-face time with patient.  This included previsit chart review, lab review, study review, order entry, electronic health record documentation, patient education.  Butch Penny, MSN, NP-C 10/05/2021, 9:38 AM [facility] [address], [address] [place], [place] [address] [phone]

## 2021-10-05 NOTE — Patient Instructions (Addendum)
Your Plan:  Continue Depakote Blood work today- check AST/ALT  Thank you for coming to see [place] at [facility]. I hope we have been able to provide you high quality care today.  You may receive a patient satisfaction survey over the next few weeks. We would appreciate your feedback and comments so that we may continue to improve ourselves and the health of our patients.

## 2021-10-06 LAB — COMPREHENSIVE METABOLIC PANEL
ALT: 45 IU/L — ABNORMAL HIGH (ref 0–44)
AST: 63 IU/L — ABNORMAL HIGH (ref 0–40)
Albumin/Globulin Ratio: 1.8 (ref 1.2–2.2)
Albumin: 4.4 g/dL (ref 3.8–4.9)
Alkaline Phosphatase: 76 IU/L (ref 44–121)
BUN/Creatinine Ratio: 15 (ref 9–20)
BUN: 16 mg/dL (ref 6–24)
Bilirubin Total: 0.3 mg/dL (ref 0.0–1.2)
CO2: 28 mmol/L (ref 20–29)
Calcium: 9.2 mg/dL (ref 8.7–10.2)
Chloride: 99 mmol/L (ref 96–106)
Creatinine, Ser: 1.09 mg/dL (ref 0.76–1.27)
Globulin, Total: 2.4 g/dL (ref 1.5–4.5)
Glucose: 71 mg/dL (ref 70–99)
Potassium: 4.2 mmol/L (ref 3.5–5.2)
Sodium: 143 mmol/L (ref 134–144)
Total Protein: 6.8 g/dL (ref 6.0–8.5)
eGFR: 81 mL/min/{1.73_m2} (ref >59)

## 2021-10-09 ENCOUNTER — Other Ambulatory Visit: Payer: Self-pay

## 2021-10-09 ENCOUNTER — Telehealth: Payer: Self-pay | Admitting: *Deleted

## 2021-10-09 ENCOUNTER — Ambulatory Visit: Payer: 59 | Admitting: Neurology

## 2021-10-09 DIAGNOSIS — R569 Unspecified convulsions: Secondary | ICD-10-CM

## 2021-10-09 NOTE — Telephone Encounter (Signed)
I called pt and relayed that the repeat AST/ALT slightly abnormal. Continue to monitor for now.  He appreciated call back.

## 2021-10-09 NOTE — Telephone Encounter (Signed)
Please see msg below. AST and ALT are similar to previous. Just slightly abnormal. Will continue to monitor for now

## 2021-10-09 NOTE — Telephone Encounter (Signed)
Jonathan [name] and his wife requested a call back regarding blood work he had recently with [place]. If someone could please give them a call, that would be appreciated.

## 2021-10-09 NOTE — Telephone Encounter (Signed)
Patient was seen by our office by [facility] December 2, CMP showed slight elevated AST, ALT, that was about his baseline level

## 2021-10-10 NOTE — Progress Notes (Signed)
Chart reviewed, patient had recurrent nocturnal seizure while taking Depakote ER 1500 mg every night, compliance with his medications, within therapeutic level, may consider add on second agent such as lamotrigine, taper down Depakote to lower level to avoid polypharmacy side effect

## 2021-10-16 ENCOUNTER — Telehealth: Payer: Self-pay | Admitting: Adult Health

## 2021-10-16 MED ORDER — LAMOTRIGINE 25 MG PO TABS: ORAL_TABLET | ORAL | 3 refills | Status: DC

## 2021-10-16 NOTE — Telephone Encounter (Signed)
I called the patient.  Advised that Dr. Terrace Arabia had reviewed his EEG and did not see anything concerning.  Dr. Terrace Arabia and I discussed his medication.  Because of his elevated liver function we will consider adding on Lamictal and hopefully weaning off of Depakote.  Patient is amenable to this plan.  He was advised to:  Start Lamictal 25 mg twice a day for 1 week then increase to 50 mg twice a day the second week.  Then increase to 75 mg twice a day the third week then increase to 100 mg twice a day thereafter  Advised patient that once he starts 75 mg twice a day he can call our office and we will reduce Depakote from 1500 mg daily to 1000 mg daily.  Ideally if he does well we will continue to wean off of Depakote.  Did caution the patient that as we are starting a new medication and weaning off a second medication there is always a risk for breakthrough seizure.  Patient was advised that at his last visit that he has to be seizure-free for 6 months before he can start driving.  Therefore now is a good time to adjust medication since he is not operating a motor vehicle.

## 2021-11-07 NOTE — Telephone Encounter (Signed)
Pt states he was asked to call and give an update on how he is doing on the Lamictal .  Pt states he is doing great, please call.

## 2021-11-08 NOTE — Telephone Encounter (Signed)
Error

## 2021-11-08 NOTE — Telephone Encounter (Signed)
I called the pt and was on the phone at the time [facility] lvm.  Pt advised to me he has been taking the Depakote 500 mg 1 per day since 11/06/2021 he confirmed again he is taking the Lamictal 75 mg bid.  I spoke with MM, NP verbally on this and she sts for him to continue the depakote 500 mg 1 per day for a total of 2 weeks and then call us back for further instructions.  I call the pt and relayed instructions and he verbalized understanding. Will plan to call us back on 11/20/21.

## 2021-11-08 NOTE — Telephone Encounter (Signed)
Yes okay to reduce Depakote to 1000 mg daily

## 2021-11-08 NOTE — Telephone Encounter (Signed)
I called pt. He is doing well.  No seizures, no side effects, or rash noted.  He started the lamictal 75mg  po bid on this last Tuesday (11-06-2021).  Depakote 1500mg  po daily.  I see from your note you wanted to decrease depakote to 1000mg  daily if stable.  Please advise.

## 2021-11-08 NOTE — Telephone Encounter (Signed)
I called and LMVM for pt. That per Aundra Millet.NP of to decrease Depakote to 1000mg  po daily.  Continue Lamictal titration as previously discussed by NP. He is to call back if questions.

## 2021-11-16 ENCOUNTER — Telehealth: Payer: Self-pay | Admitting: Adult Health

## 2021-11-16 NOTE — Telephone Encounter (Signed)
Called patient who noted he had been taking Lamictal 75 mg BID and VPA 500 mg for the past week, then stopped VPA yesterday. Realized he was supposed to be taking Lamictal 100 mg BID with VPA 500 mg for a week before stopping VPA. Now he is having headaches and fatigue, which are his typical preictal symptoms. After realizing this he took 100 mg of Lamictal for the first time this morning and restarted 500 mg of VPA.   Advised patient to continue with Lamictal 100 mg BID and VPA 500 mg daily for one week. Then if is not having any seizure-like symptoms he can stop the VPA.  Advised patient to go to the ED for evaluation if he does have a seizure.  Jonathan [name] 11/16/21 9:03 AM

## 2021-11-16 NOTE — Telephone Encounter (Signed)
Pt transitioning from divalproex (DEPAKOTE ER) 500 MG 24 hr tablet(last  time taken Monday night) to lamoTRIgine (LAMICTAL) 25 MG tablet (has been taken 3 twice daily for the last 2 wks). Pt has not been taking Lamotrigine correcting. Pt said has been experiencing the symptom before he has a seizure fatigue, body aches, headaches. Just gave him the 1 tablet to catch him up on his Lamotrigine. Should pt go back on the Divaproex to get him where he needs to be. Would like a call from the on call physician  Informed pt go to the ED if you feel pt is going to have seizure or going to have a seizure. Pt verbalized understand

## 2021-11-20 ENCOUNTER — Telehealth: Payer: Self-pay | Admitting: Adult Health

## 2021-11-20 NOTE — Telephone Encounter (Signed)
Pt has called to report that for the last few days he has felt achy, he has had chills, waking up with increased headaches behind his eyes.  Pt states no fever, no positive Covid-19 test.  Pt wants to know if he should have blood work done to check his levels.  Pt believes he is feeling these things due to medication levels, please call.

## 2021-11-21 NOTE — Telephone Encounter (Signed)
I returned the pt's call. He thanked me for calling back and said it turned out that he has a sinus infection.

## 2021-11-22 ENCOUNTER — Telehealth: Payer: Self-pay | Admitting: Adult Health

## 2021-11-22 NOTE — Telephone Encounter (Signed)
He should be taking Lamictal 100 mg twice a day and Depakote 500 mg daily?  Then we were going to discontinue the Depakote.  This is how he is taking his medication currently?  If so okay to wait another week before discontinuing Depakote

## 2021-11-22 NOTE — Telephone Encounter (Signed)
Pt has had a sinus infection for a week. Want to know if good idea to transition from divalproex (DEPAKOTE ER) 500 MG 24 hr tablet to lamoTRIgine (LAMICTAL) 25 MG tablet at this time is that a good idea? Would like a call from the nurse.

## 2021-11-22 NOTE — Telephone Encounter (Signed)
I called pt and relayed that per MM/NP ok to hold on currrent dose lamotrigine 100mg  po bid and divalproex 500mg  po daily for another week prior to discontinuiing divalproex.  He said he felt better.  He will hold for another week at this dosing and then will move forward from there.

## 2021-12-10 ENCOUNTER — Telehealth: Payer: Self-pay | Admitting: Adult Health

## 2021-12-10 NOTE — Telephone Encounter (Signed)
At 11:47 pt left a vm asking if he should change the date of his upcoming appointment due to a recent change in his medication, please call.

## 2021-12-10 NOTE — Telephone Encounter (Signed)
Returned patients call spoke to patient informed him to keep his appointment 12/12/2021. Pt  thanked me for calling him back

## 2021-12-11 NOTE — Progress Notes (Signed)
PATIENT: Jonathan [name] DOB: [birth year]  REASON FOR VISIT: follow up HISTORY FROM: patient  HISTORY OF PRESENT ILLNESS: Today 12/11/21:  Mr. Jonathan [name] is a 56 year old male with a history of nocturnal seizures.  He returns today for follow-up.  The patient has now been off Depakote since Saturday.  He is on Lamictal 100 mg twice a day.  Has not had any additional seizure events.  He is tolerating the medication well.  He did have to wean off Depakote a little slower due to a sinus infection.  He is not operating a motor vehicle.   He does note a tremor.  Primarily in the upper extremities worse in the mornings.  Typically only with action does not really notice it with rest.  Not interfering with his daily activities.  No family history of tremor.  No family history of Parkinson disease.  he returns today for an evaluation.  10/05/21:Jonathan [name] is a 56 year old male with a history of nocturnal seizures.  He returns today for follow-up.  He reports that he had a seizure event on November 22.  His wife is with him today.  She reports that f that night she noticed that he was grinding his teeth moaning and frothing at the mouth.  She states that he was stiff this lasted what she thinks was several minutes but she did not officially time him.  She states that she did wake him up and he was a little confused at first but that improved.  The patient reports that he did have a beer he thinks it was only 1 beer before bedtime.  His grandson was diagnosed with the flu on Saturday.  The patient reports that he was diagnosed with the flu a day and a half after having his seizure event.  He is feeling much better today.  He is currently on Depakote 1500 mg daily.  There was some discrepancy in his chart that he was taking 2000 mg daily.  However he clarifies that he is only taken 1500 mg daily.  Blood work done earlier this week showed elevated LFTs.  However Depakote level was back in normal  range.  03/07/20: Jonathan [name] is a 56 year old male with a history of nocturnal seizures.  He returns today for follow-up.  He reports he has not had any additional seizure events.  Continues on Depakote 2000 mg daily.  Denies any significant side effects.  No change in mood or behavior.  He is able to complete all ADLs independently.  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  06/07/20: Jonathan [name] is a 56 year old male with a history of nocturnal seizures.  He returns today for evaluation.  He states that on Monday night his wife noticed that he was very agitated while he was sleeping moving about in bed and was making a weird noise.  He states that this has been consistent with previous seizure events.  He states that several days before he did miss a dose of Depakote.  He returns today for an evaluation.   HISTORY Update Mar 07, 2020 SS: Jonathan [name] is a 56 year old male with history of seizure disorder and migraine headache.  He remains on Depakote ER 500 mg, 2 tablets at bedtime. His last seizure was in December 2017, he has only ever had 2 seizures, both nocturnal.  He is self-employed, has a Set designer.  His seizures and migraines are currently under good control. He may occasionally have a flash of light, like an  aura, but no headache results.  His overall health is good.  He presents today for evaluation unaccompanied.  REVIEW OF SYSTEMS: Out of a complete 14 system review of symptoms, the patient complains only of the following symptoms, and all other reviewed systems are negative.  See HPi  ALLERGIES: Allergies  Allergen Reactions   Bee Venom Other (See Comments)    Childhood - hospitalization    HOME MEDICATIONS: Outpatient Medications Prior to Visit  Medication Sig Dispense Refill   divalproex (DEPAKOTE ER) 500 MG 24 hr tablet Take 3 tablets (1,500 mg total) by mouth daily. 90 tablet 4   lamoTRIgine (LAMICTAL) 25 MG tablet Take 1 tablet twice a day for 1 week then 2 tablets BID  for 1 week, then 3 tablets BID for 1 week then 4 tablets BID thereafter 240 tablet 3   lisinopril-hydrochlorothiazide (PRINZIDE,ZESTORETIC) 20-25 MG tablet Take 0.5 tablets by mouth at bedtime.  10   oseltamivir (TAMIFLU) 75 MG capsule Take 1 capsule (75 mg total) by mouth every 12 (twelve) hours. 10 capsule 0   No facility-administered medications prior to visit.    PAST MEDICAL HISTORY: Past Medical History:  Diagnosis Date   Bee sting allergy    HTN (hypertension)    Migraines    Seizure (HCC)     PAST SURGICAL HISTORY: Past Surgical History:  Procedure Laterality Date   NO PAST SURGERIES      FAMILY HISTORY: Family History  Problem Relation Age of Onset   Hypertension Mother    Heart attack Father    Cancer Maternal Grandmother        breast    SOCIAL HISTORY: Social History   Socioeconomic History   Marital status: Married    Spouse name: [name]   Number of children: 1   Years of education: 16   Highest education level: Not on file  Occupational History   Occupation: [employer]    Comment: Self-employed  Tobacco Use   Smoking status: Never   Smokeless tobacco: Never  Substance and Sexual Activity   Alcohol use: Yes    Alcohol/week: 0.0 standard drinks    Comment: 5-6 drinks/week   Drug use: No   Sexual activity: Not on file  Other Topics Concern   Not on file  Social History Narrative   Right-handed.   Lives at home with her wife.   2 cups caffeine/day.   One child.   Social Determinants of Health   Financial Resource Strain: Not on file  Food Insecurity: Not on file  Transportation Needs: Not on file  Physical Activity: Not on file  Stress: Not on file  Social Connections: Not on file  Intimate Partner Violence: Not on file      PHYSICAL EXAM  Vitals:   12/12/21 0845  BP: 110/77  Pulse: 81  Weight: 150 lb 3.2 oz (68.1 kg)  Height: 5\' 6"  (1.676 m)    Body mass index is 24.24 kg/m.  Generalized: Well developed, in no  acute distress   Neurological examination  Mentation: Alert oriented to time, place, history taking. Follows all commands speech and language fluent Cranial nerve II-XII: Pupils were equal round reactive to light. Extraocular movements were full, visual field were full on confrontational test.. Head turning and shoulder shrug  were normal and symmetric. Motor: The motor testing reveals 5 over 5 strength of all 4 extremities. Good symmetric motor tone is noted throughout.  Sensory: Sensory testing is intact to soft touch on all 4 extremities.  No evidence of extinction is noted.  Coordination: Cerebellar testing reveals good finger-nose-finger and heel-to-shin bilaterally.  Mild tremor in the upper extremities with finger-nose-finger Gait and station: Gait is normal.  Good stride good arm swing one-step with turns Reflexes: Deep tendon reflexes are symmetric and normal bilaterally.   DIAGNOSTIC DATA (LABS, IMAGING, TESTING) - I reviewed patient records, labs, notes, testing and imaging myself where available.  Lab Results  Component Value Date   WBC 7.1 09/26/2021   HGB 14.9 09/26/2021   HCT 44.2 09/26/2021   MCV 98 (H) 09/26/2021   PLT 101 (L) 09/26/2021      Component Value Date/Time   NA 143 10/05/2021 1023   K 4.2 10/05/2021 1023   CL 99 10/05/2021 1023   CO2 28 10/05/2021 1023   GLUCOSE 71 10/05/2021 1023   GLUCOSE 120 (H) 05/30/2021 1230   BUN 16 10/05/2021 1023   CREATININE 1.09 10/05/2021 1023   CALCIUM 9.2 10/05/2021 1023   PROT 6.8 10/05/2021 1023   ALBUMIN 4.4 10/05/2021 1023   AST 63 (H) 10/05/2021 1023   ALT 45 (H) 10/05/2021 1023   ALKPHOS 76 10/05/2021 1023   BILITOT 0.3 10/05/2021 1023   GFRNONAA >60 05/30/2021 1230   GFRAA 90 12/11/2020 0855      ASSESSMENT AND PLAN 56 y.o. year old male  has a past medical history of Bee sting allergy, HTN (hypertension), Migraines, and Seizure (HCC). here with:  Seizures  Continue Lamictal 100 mg twice a day Blood  work today Patient is aware he should not drive until seizure-free for 6 months  Tremor  Mild-upper extremities appears to be only with action.  No family history of tremor that he is aware of.  We will continue to monitor for now   Follow-up in 6 months or sooner if needed   I spent 45 minutes of face-to-face and non-face-to-face time with patient.  This included previsit chart review, lab review, study review, order entry, electronic health record documentation, patient education.  Butch Penny, MSN, NP-C 12/11/2021, 2:45 PM [facility] [address] [place], [place] [address] [phone]

## 2021-12-12 ENCOUNTER — Ambulatory Visit: Payer: Managed Care, Other (non HMO) | Admitting: Adult Health

## 2021-12-12 ENCOUNTER — Encounter: Payer: Self-pay | Admitting: Adult Health

## 2021-12-12 VITALS — BP 110/77 | HR 81 | Ht 66.0 in | Wt 150.2 lb

## 2021-12-12 DIAGNOSIS — Z5181 Encounter for therapeutic drug level monitoring: Secondary | ICD-10-CM | POA: Diagnosis not present

## 2021-12-12 DIAGNOSIS — R7989 Other specified abnormal findings of blood chemistry: Secondary | ICD-10-CM | POA: Diagnosis not present

## 2021-12-12 DIAGNOSIS — R569 Unspecified convulsions: Secondary | ICD-10-CM

## 2021-12-12 NOTE — Patient Instructions (Addendum)
Continue Lamictal 100 mg twice a day Blood work today Monitor tremor If you have any seizure events please let us know.

## 2021-12-13 ENCOUNTER — Telehealth: Payer: Self-pay

## 2021-12-13 NOTE — Telephone Encounter (Signed)
I called the pt and relayed message from NP. He verbalized understanding and appreciation for the phone call

## 2021-12-13 NOTE — Telephone Encounter (Signed)
-----   Message from Butch Penny, NP sent at 12/13/2021 11:09 AM EST ----- Blood work is relatively unremarkable.  Liver function is back in normal range.  Waiting on Lamictal level

## 2021-12-14 LAB — COMPREHENSIVE METABOLIC PANEL
ALT: 18 IU/L (ref 0–44)
AST: 33 IU/L (ref 0–40)
Albumin/Globulin Ratio: 1.5 (ref 1.2–2.2)
Albumin: 4.3 g/dL (ref 3.8–4.9)
Alkaline Phosphatase: 78 IU/L (ref 44–121)
BUN/Creatinine Ratio: 16 (ref 9–20)
BUN: 15 mg/dL (ref 6–24)
Bilirubin Total: 0.5 mg/dL (ref 0.0–1.2)
CO2: 24 mmol/L (ref 20–29)
Calcium: 9.9 mg/dL (ref 8.7–10.2)
Chloride: 100 mmol/L (ref 96–106)
Creatinine, Ser: 0.92 mg/dL (ref 0.76–1.27)
Globulin, Total: 2.8 g/dL (ref 1.5–4.5)
Glucose: 90 mg/dL (ref 70–99)
Potassium: 4.4 mmol/L (ref 3.5–5.2)
Sodium: 140 mmol/L (ref 134–144)
Total Protein: 7.1 g/dL (ref 6.0–8.5)
eGFR: 99 mL/min/{1.73_m2} (ref >59)

## 2021-12-14 LAB — CBC WITH DIFFERENTIAL/PLATELET
Basophils Absolute: 0.1 x10E3/uL (ref 0.0–0.2)
Basos: 3 %
EOS (ABSOLUTE): 0.1 x10E3/uL (ref 0.0–0.4)
Eos: 2 %
Hematocrit: 37.7 % (ref 37.5–51.0)
Hemoglobin: 12.8 g/dL — ABNORMAL LOW (ref 13.0–17.7)
Immature Grans (Abs): 0 x10E3/uL (ref 0.0–0.1)
Immature Granulocytes: 1 %
Lymphocytes Absolute: 1 x10E3/uL (ref 0.7–3.1)
Lymphs: 20 %
MCH: 32.1 pg (ref 26.6–33.0)
MCHC: 34 g/dL (ref 31.5–35.7)
MCV: 95 fL (ref 79–97)
Monocytes Absolute: 0.9 x10E3/uL (ref 0.1–0.9)
Monocytes: 18 %
Neutrophils Absolute: 2.9 x10E3/uL (ref 1.4–7.0)
Neutrophils: 56 %
Platelets: 189 x10E3/uL (ref 150–450)
RBC: 3.99 x10E6/uL — ABNORMAL LOW (ref 4.14–5.80)
RDW: 12.1 % (ref 11.6–15.4)
WBC: 5.1 x10E3/uL (ref 3.4–10.8)

## 2021-12-14 LAB — LAMOTRIGINE LEVEL: Lamotrigine Lvl: 8.4 ug/mL (ref 2.0–20.0)

## 2021-12-17 ENCOUNTER — Telehealth: Payer: Self-pay

## 2021-12-17 NOTE — Telephone Encounter (Signed)
-----   Message from Ward Givens, NP sent at 12/17/2021  9:55 AM EST ----- Lamictal is in normal range. Please call patient

## 2021-12-17 NOTE — Telephone Encounter (Signed)
I called patient. I discussed his lab results. Pt verbalized understanding of results. Pt had no questions at this time but was encouraged to call back if questions arise.

## 2021-12-27 NOTE — Procedures (Signed)
° °  HISTORY: 56 year old male, with history of seizure disorder, on Depakote lamotrigine  TECHNIQUE:  This is a routine 16 channel EEG recording with one channel devoted to a limited EKG recording.  It was performed during wakefulness, drowsiness and asleep.  Hyperventilation and photic stimulation were performed as activating procedures.  There are frequent muscle and movement artifact.  Upon maximum arousal, posterior dominant waking rhythm consistent of mixed rhythmic alpha and beta range. Activities are symmetric over the bilateral posterior derivations and attenuated with eye opening.  Hyperventilation produced mild/moderate buildup with higher amplitude and the slower activities noted.  Photic stimulation did not alter the tracing.  During EEG recording, patient developed drowsiness and no deeper stage of sleep was achieved  During EEG recording, there was no epileptiform discharge noted.    EKG demonstrate sinus rhythm, with heart rate of 54 beats a minute  CONCLUSION: This is a  normal awake EEG.  There is no electrodiagnostic evidence of epileptiform discharge.  Marcial Pacas, M.D. Ph.D.  [facility] [place], [place] [address] Phone: [phone] Fax:      [phone]

## 2022-01-01 ENCOUNTER — Telehealth: Payer: Self-pay

## 2022-01-01 NOTE — Telephone Encounter (Signed)
I called patient. I advised him of his normal EEG. Patient will keep his follow up in August and let us know if he has any seizure activity. Pt verbalized understanding of results. Pt had no questions at this time but was encouraged to call back if questions arise.

## 2022-01-01 NOTE — Telephone Encounter (Signed)
-----   Message from Butch Penny, NP sent at 12/30/2021 12:32 PM EST ----- EEG is normal. Please call patient

## 2022-03-04 ENCOUNTER — Other Ambulatory Visit: Payer: Self-pay | Admitting: Adult Health

## 2022-06-11 ENCOUNTER — Ambulatory Visit: Payer: Managed Care, Other (non HMO) | Admitting: Adult Health

## 2022-07-11 ENCOUNTER — Other Ambulatory Visit: Payer: Self-pay | Admitting: Adult Health

## 2022-08-29 ENCOUNTER — Encounter: Payer: Self-pay | Admitting: Adult Health

## 2022-08-29 ENCOUNTER — Ambulatory Visit: Payer: Commercial Managed Care - HMO | Admitting: Adult Health

## 2022-08-29 VITALS — BP 148/81 | HR 60 | Ht 66.0 in | Wt 136.4 lb

## 2022-08-29 DIAGNOSIS — Z5181 Encounter for therapeutic drug level monitoring: Secondary | ICD-10-CM

## 2022-08-29 DIAGNOSIS — R569 Unspecified convulsions: Secondary | ICD-10-CM

## 2022-08-29 MED ORDER — LAMOTRIGINE 100 MG PO TABS: 100.0000 mg | ORAL_TABLET | Freq: Two times a day (BID) | ORAL | 3 refills | Status: DC

## 2022-08-29 NOTE — Progress Notes (Signed)
PATIENT: Jonathan [name] DOB: 01-13-1966  REASON FOR VISIT: follow up HISTORY FROM: patient  Chief Complaint  Patient presents with   Follow-up    Pt in 5  Pt here for tremor/seizures f/u Pt states no questions or concerns for this visit      HISTORY OF PRESENT ILLNESS: Today 08/29/22:  Jonathan [name] is a 56 year old male with a history of nocturnal seizures.  He returns today for follow-up.  He is now on Lamictal 100 mg twice a day.  Has not had any additional seizure events.  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  Returns today for an evaluation.   12/12/21 Jonathan [name] is a 56 year old male with a history of nocturnal seizures.  He returns today for follow-up.  The patient has now been off Depakote since Saturday.  He is on Lamictal 100 mg twice a day.  Has not had any additional seizure events.  He is tolerating the medication well.  He did have to wean off Depakote a little slower due to a sinus infection.  He is not operating a motor vehicle.   He does note a tremor.  Primarily in the upper extremities worse in the mornings.  Typically only with action does not really notice it with rest.  Not interfering with his daily activities.  No family history of tremor.  No family history of Parkinson disease.  he returns today for an evaluation.  10/05/21:Jonathan [name] is a 56 year old male with a history of nocturnal seizures.  He returns today for follow-up.  He reports that he had a seizure event on November 22.  His wife is with him today.  She reports that f that night she noticed that he was grinding his teeth moaning and frothing at the mouth.  She states that he was stiff this lasted what she thinks was several minutes but she did not officially time him.  She states that she did wake him up and he was a little confused at first but that improved.  The patient reports that he did have a beer he thinks it was only 1 beer before bedtime.  His grandson was diagnosed with the flu on  Saturday.  The patient reports that he was diagnosed with the flu a day and a half after having his seizure event.  He is feeling much better today.  He is currently on Depakote 1500 mg daily.  There was some discrepancy in his chart that he was taking 2000 mg daily.  However he clarifies that he is only taken 1500 mg daily.  Blood work done earlier this week showed elevated LFTs.  However Depakote level was back in normal range.  03/07/20: Jonathan [name] is a 56 year old male with a history of nocturnal seizures.  He returns today for follow-up.  He reports he has not had any additional seizure events.  Continues on Depakote 2000 mg daily.  Denies any significant side effects.  No change in mood or behavior.  He is able to complete all ADLs independently.  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  06/07/20: Jonathan [name] is a 56 year old male with a history of nocturnal seizures.  He returns today for evaluation.  He states that on Monday night his wife noticed that he was very agitated while he was sleeping moving about in bed and was making a weird noise.  He states that this has been consistent with previous seizure events.  He states that several days before he did miss a dose  of Depakote.  He returns today for an evaluation.   HISTORY Update Mar 07, 2020 SS: Jonathan [name] is a 56 year old male with history of seizure disorder and migraine headache.  He remains on Depakote ER 500 mg, 2 tablets at bedtime. His last seizure was in December 2017, he has only ever had 2 seizures, both nocturnal.  He is self-employed, has a Set designer.  His seizures and migraines are currently under good control. He may occasionally have a flash of light, like an aura, but no headache results.  His overall health is good.  He presents today for evaluation unaccompanied.  REVIEW OF SYSTEMS: Out of a complete 14 system review of symptoms, the patient complains only of the following symptoms, and all other reviewed systems are  negative.  See HPi  ALLERGIES: Allergies  Allergen Reactions   Bee Venom Other (See Comments)    Childhood - hospitalization    HOME MEDICATIONS: Outpatient Medications Prior to Visit  Medication Sig Dispense Refill   divalproex (DEPAKOTE ER) 500 MG 24 hr tablet Take 3 tablets (1,500 mg total) by mouth daily. 90 tablet 4   lamoTRIgine (LAMICTAL) 25 MG tablet TAKE FOUR TABLET BY MOUTH IN THE MORNING AND AT BEDTIME 240 tablet 3   lisinopril-hydrochlorothiazide (PRINZIDE,ZESTORETIC) 20-25 MG tablet Take 0.5 tablets by mouth at bedtime.  10   oseltamivir (TAMIFLU) 75 MG capsule Take 1 capsule (75 mg total) by mouth every 12 (twelve) hours. 10 capsule 0   No facility-administered medications prior to visit.    PAST MEDICAL HISTORY: Past Medical History:  Diagnosis Date   Bee sting allergy    HTN (hypertension)    Migraines    Seizure (HCC)     PAST SURGICAL HISTORY: Past Surgical History:  Procedure Laterality Date   NO PAST SURGERIES      FAMILY HISTORY: Family History  Problem Relation Age of Onset   Hypertension Mother    Heart attack Father    Cancer Maternal Grandmother        breast   Seizures Neg Hx    Tremor Neg Hx     SOCIAL HISTORY: Social History   Socioeconomic History   Marital status: Married    Spouse name: [name]   Number of children: 1   Years of education: 16   Highest education level: Not on file  Occupational History   Occupation: [employer]    Comment: Self-employed  Tobacco Use   Smoking status: Never   Smokeless tobacco: Never  Vaping Use   Vaping Use: Never used  Substance and Sexual Activity   Alcohol use: Yes    Alcohol/week: 4.0 standard drinks of alcohol    Types: 4 Cans of beer per week   Drug use: No   Sexual activity: Not on file  Other Topics Concern   Not on file  Social History Narrative   Right-handed.   Lives at home with her wife.   2 cups caffeine/day.   One child.   Social Determinants of Health    Financial Resource Strain: Not on file  Food Insecurity: Not on file  Transportation Needs: Not on file  Physical Activity: Not on file  Stress: Not on file  Social Connections: Not on file  Intimate Partner Violence: Not on file      PHYSICAL EXAM  Vitals:   08/29/22 0803  BP: (!) 148/81  Pulse: 60  Weight: 136 lb 6.4 oz (61.9 kg)  Height: 5\' 6"  (1.676 m)  Body mass index is 22.02 kg/m.  Generalized: Well developed, in no acute distress   Neurological examination  Mentation: Alert oriented to time, place, history taking. Follows all commands speech and language fluent Cranial nerve II-XII: Pupils were equal round reactive to light. Extraocular movements were full, visual field were full on confrontational test.. Head turning and shoulder shrug  were normal and symmetric. Motor: The motor testing reveals 5 over 5 strength of all 4 extremities. Good symmetric motor tone is noted throughout.  Sensory: Sensory testing is intact to soft touch on all 4 extremities. No evidence of extinction is noted.  Coordination: Cerebellar testing reveals good finger-nose-finger and heel-to-shin bilaterally.  Mild tremor in the upper extremities with finger-nose-finger Gait and station: Gait is normal.   Reflexes: Deep tendon reflexes are symmetric and normal bilaterally.   DIAGNOSTIC DATA (LABS, IMAGING, TESTING) - I reviewed patient records, labs, notes, testing and imaging myself where available.  Lab Results  Component Value Date   WBC 5.1 12/12/2021   HGB 12.8 (L) 12/12/2021   HCT 37.7 12/12/2021   MCV 95 12/12/2021   PLT 189 12/12/2021      Component Value Date/Time   NA 140 12/12/2021 0922   K 4.4 12/12/2021 0922   CL 100 12/12/2021 0922   CO2 24 12/12/2021 0922   GLUCOSE 90 12/12/2021 0922   GLUCOSE 120 (H) 05/30/2021 1230   BUN 15 12/12/2021 0922   CREATININE 0.92 12/12/2021 0922   CALCIUM 9.9 12/12/2021 0922   PROT 7.1 12/12/2021 0922   ALBUMIN 4.3 12/12/2021  0922   AST 33 12/12/2021 0922   ALT 18 12/12/2021 0922   ALKPHOS 78 12/12/2021 0922   BILITOT 0.5 12/12/2021 0922   GFRNONAA >60 05/30/2021 1230   GFRAA 90 12/11/2020 0855      ASSESSMENT AND PLAN 56 y.o. year old male  has a past medical history of Bee sting allergy, HTN (hypertension), Migraines, and Seizure (HCC). here with:  Seizures  Continue Lamictal 100 mg twice a day Blood work today Advised that he has any seizure events he should let us know Follow-up in 1 year or sooner if needed   Butch Penny, MSN, NP-C 08/29/2022, 8:36 AM [facility] [address] [place], [place] [address] [phone]

## 2022-08-30 LAB — CBC WITH DIFFERENTIAL/PLATELET
Basophils Absolute: 0.1 10*3/uL (ref 0.0–0.2)
Basos: 2 %
EOS (ABSOLUTE): 0.2 10*3/uL (ref 0.0–0.4)
Eos: 4 %
Hematocrit: 43.5 % (ref 37.5–51.0)
Hemoglobin: 14.7 g/dL (ref 13.0–17.7)
Immature Grans (Abs): 0 10*3/uL (ref 0.0–0.1)
Immature Granulocytes: 0 %
Lymphocytes Absolute: 1.2 10*3/uL (ref 0.7–3.1)
Lymphs: 29 %
MCH: 32.2 pg (ref 26.6–33.0)
MCHC: 33.8 g/dL (ref 31.5–35.7)
MCV: 95 fL (ref 79–97)
Monocytes Absolute: 0.6 10*3/uL (ref 0.1–0.9)
Monocytes: 14 %
Neutrophils Absolute: 2.3 10*3/uL (ref 1.4–7.0)
Neutrophils: 51 %
Platelets: 169 10*3/uL (ref 150–450)
RBC: 4.57 x10E6/uL (ref 4.14–5.80)
RDW: 11.8 % (ref 11.6–15.4)
WBC: 4.3 10*3/uL (ref 3.4–10.8)

## 2022-08-30 LAB — COMPREHENSIVE METABOLIC PANEL
ALT: 20 IU/L (ref 0–44)
AST: 37 IU/L (ref 0–40)
Albumin/Globulin Ratio: 2.1 (ref 1.2–2.2)
Albumin: 5 g/dL — ABNORMAL HIGH (ref 3.8–4.9)
Alkaline Phosphatase: 88 IU/L (ref 44–121)
BUN/Creatinine Ratio: 15 (ref 9–20)
BUN: 15 mg/dL (ref 6–24)
Bilirubin Total: 0.6 mg/dL (ref 0.0–1.2)
CO2: 26 mmol/L (ref 20–29)
Calcium: 10.1 mg/dL (ref 8.7–10.2)
Chloride: 98 mmol/L (ref 96–106)
Creatinine, Ser: 1 mg/dL (ref 0.76–1.27)
Globulin, Total: 2.4 g/dL (ref 1.5–4.5)
Glucose: 95 mg/dL (ref 70–99)
Potassium: 4.4 mmol/L (ref 3.5–5.2)
Sodium: 138 mmol/L (ref 134–144)
Total Protein: 7.4 g/dL (ref 6.0–8.5)
eGFR: 89 mL/min/{1.73_m2} (ref >59)

## 2022-08-30 LAB — LAMOTRIGINE LEVEL: Lamotrigine Lvl: 4 ug/mL (ref 2.0–20.0)

## 2022-09-04 ENCOUNTER — Telehealth: Payer: Self-pay | Admitting: *Deleted

## 2022-09-04 NOTE — Telephone Encounter (Signed)
-----   Message from Ward Givens, NP sent at 09/04/2022  4:52 PM EDT ----- lab work unremarkable please call patient

## 2022-09-04 NOTE — Telephone Encounter (Signed)
Spoke to pt gave lab work results. Pt expressed understanding and  thanked me for calling

## 2022-09-11 ENCOUNTER — Other Ambulatory Visit: Payer: Self-pay

## 2022-09-11 ENCOUNTER — Emergency Department (HOSPITAL_COMMUNITY)
Admission: EM | Admit: 2022-09-11 | Discharge: 2022-09-11 | Disposition: A | Discharge status: Home / Self Care | Payer: Commercial Managed Care - HMO | Attending: Emergency Medicine | Admitting: Emergency Medicine

## 2022-09-11 ENCOUNTER — Encounter (HOSPITAL_COMMUNITY): Payer: Self-pay | Admitting: Emergency Medicine

## 2022-09-11 DIAGNOSIS — R569 Unspecified convulsions: Secondary | ICD-10-CM | POA: Diagnosis present

## 2022-09-11 DIAGNOSIS — I10 Essential (primary) hypertension: Secondary | ICD-10-CM | POA: Diagnosis not present

## 2022-09-11 DIAGNOSIS — Z79899 Other long term (current) drug therapy: Secondary | ICD-10-CM | POA: Diagnosis not present

## 2022-09-11 LAB — COMPREHENSIVE METABOLIC PANEL
ALT: 21 U/L (ref 0–44)
AST: 44 U/L — ABNORMAL HIGH (ref 15–41)
Albumin: 4.1 g/dL (ref 3.5–5.0)
Alkaline Phosphatase: 58 U/L (ref 38–126)
Anion gap: 14 (ref 5–15)
BUN: 13 mg/dL (ref 6–20)
CO2: 24 mmol/L (ref 22–32)
Calcium: 9.5 mg/dL (ref 8.9–10.3)
Chloride: 98 mmol/L (ref 98–111)
Creatinine, Ser: 1.04 mg/dL (ref 0.61–1.24)
GFR, Estimated: >60 mL/min (ref >60)
Glucose, Bld: 109 mg/dL — ABNORMAL HIGH (ref 70–99)
Potassium: 4.8 mmol/L (ref 3.5–5.1)
Sodium: 136 mmol/L (ref 135–145)
Total Bilirubin: 0.9 mg/dL (ref 0.3–1.2)
Total Protein: 7 g/dL (ref 6.5–8.1)

## 2022-09-11 LAB — CBC WITH DIFFERENTIAL/PLATELET
Abs Immature Granulocytes: 0.02 10*3/uL (ref 0.00–0.07)
Basophils Absolute: 0.1 10*3/uL (ref 0.0–0.1)
Basophils Relative: 1 %
Eosinophils Absolute: 0 10*3/uL (ref 0.0–0.5)
Eosinophils Relative: 1 %
HCT: 40.7 % (ref 39.0–52.0)
Hemoglobin: 13.9 g/dL (ref 13.0–17.0)
Immature Granulocytes: 0 %
Lymphocytes Relative: 19 %
Lymphs Abs: 1 10*3/uL (ref 0.7–4.0)
MCH: 32.5 pg (ref 26.0–34.0)
MCHC: 34.2 g/dL (ref 30.0–36.0)
MCV: 95.1 fL (ref 80.0–100.0)
Monocytes Absolute: 0.6 10*3/uL (ref 0.1–1.0)
Monocytes Relative: 11 %
Neutro Abs: 3.6 10*3/uL (ref 1.7–7.7)
Neutrophils Relative %: 68 %
Platelets: 180 10*3/uL (ref 150–400)
RBC: 4.28 MIL/uL (ref 4.22–5.81)
RDW: 11.5 % (ref 11.5–15.5)
WBC: 5.3 10*3/uL (ref 4.0–10.5)
nRBC: 0 % (ref 0.0–0.2)

## 2022-09-11 LAB — VALPROIC ACID LEVEL: Valproic Acid Lvl: <10 ug/mL — ABNORMAL LOW (ref 50.0–100.0)

## 2022-09-11 MED ORDER — LAMOTRIGINE 25 MG PO TABS
100.0000 mg | ORAL_TABLET | Freq: Once | ORAL | Status: AC
  Administered 2022-09-11: 75 mg via ORAL
  Filled 2022-09-11: qty 4

## 2022-09-11 MED ORDER — ONDANSETRON HCL 4 MG PO TABS
4.0000 mg | ORAL_TABLET | Freq: Once | ORAL | Status: AC
  Administered 2022-09-11: 4 mg via ORAL
  Filled 2022-09-11: qty 1

## 2022-09-11 MED ORDER — ACETAMINOPHEN 500 MG PO TABS
1000.0000 mg | ORAL_TABLET | Freq: Once | ORAL | Status: AC
  Administered 2022-09-11: 1000 mg via ORAL
  Filled 2022-09-11: qty 2

## 2022-09-11 MED ORDER — LAMOTRIGINE 25 MG PO TABS
25.0000 mg | ORAL_TABLET | Freq: Once | ORAL | Status: AC
  Administered 2022-09-11: 25 mg via ORAL
  Filled 2022-09-11: qty 1

## 2022-09-11 NOTE — Discharge Instructions (Addendum)
You were seen in the ED after you had a seizure while at work. We believe this is likely because you have been missing multiple of your seizure medication doses. Your blood work during this ED visit did not show signs of infection or electrolyte abnormalities.   We believe your are stable and we are discharging you home with the following precautions: Take your evening dose of Lamictal tonight. Please do not miss any of your prescribed medications Do not drive or operate any motorized vehicle until checking with your neurologist  Treat your HA with Tylenol as needed.  Refrain from drinking alcohol, drink plenty of fluids, and eat regularly to decrease the risk of seizures. Return to the ED if seizures last for than >5 min or are recurrent in one day, or if your current symptoms worsen.  Take care, Morene Crocker, MD

## 2022-09-11 NOTE — ED Triage Notes (Signed)
Pt BIB [facility], called out by coworkers for seizure. On EMS arrival pt was on the ground breathing normally, no witnessed seizure activity. Coworkers stated that pt had a witnessed seizure lasting 5 minutes. No fall noted, was lowered to ground prior to event, abrasion to R elbow. No head injuries. Per coworker, pt has a hx of seizures, unable to confirm with pt. Responsive to painful stimuli initially, becoming more oriented en route. 18g L AC. A/ox4, GCS 14 on arrival.

## 2022-09-11 NOTE — ED Provider Notes (Signed)
[facility] EMERGENCY DEPARTMENT Provider Note   CSN: [id number] Arrival date & time: 09/11/22  1123     History  Chief complaint: seizure at work   Jonathan [name] is a 56 y.o. male living with a history of HTN, migraine headaches, and seizures presenting to the ED via EMS after an acute seizure while at work  Patient was accompanied by coworker and friend, who reports that he was as his usual when he came to work but that around 10 am patient began having rhythmic rightward movements of his head. He then notice patient bringing his upper limbs toward his chest, and clenching his fists. He was not speaking and was not conscious this was going on. The episode lasted for <5 min, but friend was not keeping track. After this episode, patient seemed confused but redirectable. Friend brought patient outside and called EMS as he was still confused. No uricary of fecal incontinence. Patient did not hit head.   At the start of this interview, patient was still post ictal , could not remember medical history, and distraught about the recent seizure episode. After friend interview, patient was able to answer more questions. He did not remember the episode.  Patient recalled history of seizures and that he has seen his neurologist in the past few weeks. He has been forgetting to take the nighttime dose of his one seizure medications frequently this past week. After seizure episode, patient with   No chest pain, syncopal or presyncopal episodes. No recent history of headaches, new triggers that could have resulted in the seizure. Patient denies recent alcohol use. Denies recent history of fever, chills, URI sxs, cough, sob, abdominal pain, polyuria, dysuria. Patient denies new prescribed or over-the-counter medications.   Patient also endorses often forgets to take blood pressure medications. He did not take his medications this AM.   HPI     Home Medications Prior to Admission  medications   Medication Sig Start Date End Date Taking? Authorizing Provider  lamoTRIgine (LAMICTAL) 100 MG tablet Take 1 tablet (100 mg total) by mouth 2 (two) times daily. 08/29/22   Butch Penny, NP  lisinopril-hydrochlorothiazide (PRINZIDE,ZESTORETIC) 20-25 MG tablet Take 0.5 tablets by mouth at bedtime. 09/14/15   [provider]  oseltamivir (TAMIFLU) 75 MG capsule Take 1 capsule (75 mg total) by mouth every 12 (twelve) hours. 09/26/21   Theadora Rama Scales, PA-C      Allergies    Bee venom    Review of Systems   Review of Systems  Constitutional: Negative.   HENT: Negative.    Eyes: Negative.   Respiratory: Negative.    Cardiovascular: Negative.   Gastrointestinal: Negative.   Endocrine: Negative.   Genitourinary: Negative.   Musculoskeletal: Negative.   Allergic/Immunologic: Negative.   Neurological: Negative.   Hematological: Negative.   Psychiatric/Behavioral: Negative.      Physical Exam Updated Vital Signs BP (!) 166/90   Pulse 78   Temp 97.9 F (36.6 C) (Oral)   Resp 15   SpO2 99%  Physical Exam Constitutional:      Appearance: Normal appearance.  HENT:     Head: Normocephalic and atraumatic.  Eyes:     Extraocular Movements: Extraocular movements intact.     Pupils: Pupils are equal, round, and reactive to light.  Cardiovascular:     Rate and Rhythm: Normal rate and regular rhythm.     Heart sounds: No murmur heard.    No gallop.  Pulmonary:  Effort: Pulmonary effort is normal.     Breath sounds: Normal breath sounds.  Abdominal:     General: Abdomen is flat. Bowel sounds are normal.     Palpations: Abdomen is soft.  Genitourinary:    Comments: No suprapubic tenderness Musculoskeletal:        General: No swelling, tenderness, deformity or signs of injury. Normal range of motion.     Cervical back: Normal range of motion.     Right lower leg: No edema.     Left lower leg: No edema.  Skin:    General: Skin is warm and dry.   Neurological:     General: No focal deficit present.     Mental Status: He is alert and oriented to person, place, and time.     Cranial Nerves: No cranial nerve deficit.     Sensory: No sensory deficit.     Motor: No weakness.     Coordination: Coordination normal.     Comments: Difficulty recalling recent events, slowly able to remember past medical history. Able to recognize wife, friend Melvyn Neth in the room, and his medical history  Psychiatric:        Mood and Affect: Mood normal.        Behavior: Behavior normal.        Thought Content: Thought content normal.        Judgment: Judgment normal.     ED Results / Procedures / Treatments   Labs (all labs ordered are listed, but only abnormal results are displayed) Labs Reviewed  VALPROIC ACID LEVEL - Abnormal; Notable for the following components:      Result Value   Valproic Acid Lvl <10 (*)    All other components within normal limits  COMPREHENSIVE METABOLIC PANEL - Abnormal; Notable for the following components:   Glucose, Bld 109 (*)    AST 44 (*)    All other components within normal limits  CBC WITH DIFFERENTIAL/PLATELET  LAMOTRIGINE LEVEL    EKG EKG Interpretation  Date/Time:  Wednesday September 11 2022 11:34:06 EST Ventricular Rate:  99 PR Interval:  138 QRS Duration: 92 QT Interval:  366 QTC Calculation: 470 R Axis:   62 Text Interpretation: Sinus rhythm Probable left atrial enlargement Left ventricular hypertrophy No significant change since last tracing Confirmed by Gwyneth Sprout ([id number]) on 09/11/2022 11:37:03 AM  Radiology No results found.  Procedures Procedures  Medications Ordered in ED Medications  lamoTRIgine (LAMICTAL) tablet 100 mg (75 mg Oral Given 09/11/22 1221)  lamoTRIgine (LAMICTAL) tablet 25 mg (25 mg Oral Given 09/11/22 1233)  acetaminophen (TYLENOL) tablet 1,000 mg (1,000 mg Oral Given 09/11/22 1304)  ondansetron (ZOFRAN) tablet 4 mg (4 mg Oral Given 09/11/22 1359)    ED Course/  Medical Decision Making/ A&P                           Medical Decision Making Amount and/or Complexity of Data Reviewed Labs: ordered.  Risk Prescription drug management.   Patient with well documented history of seizure presenting in the ED post ictal after seizure episode at work. No evidence of new triggers on evaluation. Suspect this is likely in the setting of self-reported multiple episodes of medication nonadherence for the past few weeks. Recent Lamictal drug level at low end of normal therapeutic range, not inclined to re-test today. On physical examination, patient is slowly returning to his baseline mental and functional status, confirmed by friend and wife at bedside.  Not inclined to order CT scan at this time, but will await for CBC and CMP for rule out possible metabolic abnormalities. Will order a one-time dose of Lamictal 100 mg and tylenol for HA.  CBC and CMP wnl.  Tylenol and Lamictal given at 12:21PM. Patient feeling nauseated at this time, ordered 1x 4mg  Zofran.  On re-evaluation, patient's nausea had resolved and HA was significantly improved per patient. Patient able to tolerate PO intake.  Answered patient's wife questions' about how to ensure safety while at patient's work and increase adherence with scheduled medications. Patient was also advised decrease activities that decrease seizure threshold such as lack of sleep, fasting, alcohol use, flashing or flickering lights. Patient was instructed to call Neurologist to update on his seizure status and evaluate for need of medication management. Precautionary symptoms for return reviewed. Patient stable for discharge.  Final Clinical Impression(s) / ED Diagnoses Final diagnoses:  Seizure [facility])    Rx / DC Orders ED Discharge Orders     None         [facility], MD 09/11/22 1441    13/08/23, MD 09/16/22 1011

## 2022-09-11 NOTE — ED Notes (Signed)
Discharge instructions reviewed with patient and friends. Pt denies any questions or concerns at this time. Pt out to lobby via wheelchair with friends.

## 2022-09-12 LAB — LAMOTRIGINE LEVEL: Lamotrigine Lvl: 2 ug/mL (ref 2.0–20.0)

## 2022-09-16 ENCOUNTER — Telehealth: Payer: Self-pay | Admitting: Adult Health

## 2022-09-16 NOTE — Telephone Encounter (Signed)
We can check Lamictal level in 1 month.  Most importantly that he does not forget any medications.  Use a pillbox and set an alarm on his phone for reminder.  We do not need to increase the dose just resume taking his medication consistently.  However if he has another seizure event while taking the medication consistently then yes the dose will need to be adjusted.  Per [facility] law he cannot drive until seizure-free for 6 months

## 2022-09-16 NOTE — Telephone Encounter (Signed)
I called wife back.  I relayed that we can check Lamictal level in 1 month. Made appt 10-16-2022 at 0800 for lamotrigine level. Most importantly that he does not forget any medications.  Use a pillbox and set an alarm on his phone for reminder.  We do not need to increase the dose just resume taking his medication consistently.  However if he has another seizure event while taking the medication consistently then yes the dose will need to be adjusted.  Per [facility] law he cannot drive until seizure-free for 6 months.  She verbalized understanding.

## 2022-09-16 NOTE — Telephone Encounter (Signed)
Pt's wife called stating that the pt had a seizure on 11/8 and it may have been due to him missing a dose on Monday the 6th. She states that they pulled up his last labs at the ER and they informed her that his medication dosage may be to low. Please call wife back on DPR to discuss.

## 2022-09-16 NOTE — Telephone Encounter (Signed)
I called and spoke to wife.  Pt had seizure due to not taking medication (forgetting to take doses).  See ED note 09-16-2022.  She is asking if needs another lab test after he has taken it consistently for a while.  His level was 2,  then back in 08/29/2022 was 4.  Does he need increased dose to cover if misses dose?  They are now using pillbox.  She asked about driving.  I relayed per [place] law that no driving for 6 months.  She said has heard some conflicting reports if know reason for seizure this may not be the rule.  Please advise.

## 2022-09-17 ENCOUNTER — Other Ambulatory Visit: Payer: Self-pay | Admitting: *Deleted

## 2022-09-17 ENCOUNTER — Telehealth: Payer: Self-pay | Admitting: Adult Health

## 2022-09-17 DIAGNOSIS — R569 Unspecified convulsions: Secondary | ICD-10-CM

## 2022-09-17 DIAGNOSIS — Z5181 Encounter for therapeutic drug level monitoring: Secondary | ICD-10-CM

## 2022-09-17 NOTE — Telephone Encounter (Signed)
Pt is asking for a call from RN to discuss Seizure episode on Wed of last week

## 2022-09-17 NOTE — Telephone Encounter (Signed)
There is an existing phone note for this. Combined into note from 09/16/22.

## 2022-09-17 NOTE — Telephone Encounter (Signed)
Chase Caller M routed this conversation to [employer] Call[employer] Ileene Hutchinson   LB   09/17/22  8:36 AM Note Pt is asking for a call from RN to discuss Seizure episode on Wed of last week

## 2022-09-17 NOTE — Telephone Encounter (Signed)
Called pt back. Confirmed he is aware of plan to have his medication level checked on 10/16/22 at 8 AM. Pt's questions were answered. He confirmed his is taking Lamotrigine 100 mg BID. He uses an alarm on his watch but still worries he missed that one dose. He states he didn't realize even 1 missed dose can cause a seizure. He is not driving and won't drive for 6  months. He states he gets rides where he can but also rides a bike wearing a helmet. He verbalized appreciation for the call.

## 2022-10-16 ENCOUNTER — Other Ambulatory Visit: Payer: Commercial Managed Care - HMO

## 2022-12-11 ENCOUNTER — Ambulatory Visit: Payer: Managed Care, Other (non HMO) | Admitting: Adult Health

## 2023-06-09 ENCOUNTER — Telehealth: Payer: Self-pay | Admitting: Adult Health

## 2023-06-09 NOTE — Telephone Encounter (Signed)
Noted  

## 2023-06-09 NOTE — Telephone Encounter (Signed)
Pt states this past Saturday leaned over to scratch his leg and loss balanced and hit side of head Pt states ha small bump but went away in two hours . Pt states no dizziness,nausea,vomiting,headaches Pt declined ED visit . Pt states didn't feel like it was a seizure . Pt states couldn't remember when the last time he took an Asprin pt  states more than a month . Informed pt  if  have  increased headaches,nausea,vomiting,or seizure like activity please go to ED to get evaluated. Informed  pt please call his PCP to make aware and will forward  Aundra Millet ,NP pt expressed understanding and thanked me for calling

## 2023-06-09 NOTE — Telephone Encounter (Signed)
Pt has called to report an episode pf loss balance on Saturday. Pt hit his head on a chair, no bump, did not go out of conscience.  He wanted Jonathan Millet, NP to be aware, and would like to know if this is something to be concerned about.

## 2023-07-23 DIAGNOSIS — E559 Vitamin D deficiency, unspecified: Secondary | ICD-10-CM | POA: Diagnosis not present

## 2023-07-23 DIAGNOSIS — E785 Hyperlipidemia, unspecified: Secondary | ICD-10-CM | POA: Diagnosis not present

## 2023-07-23 DIAGNOSIS — R634 Abnormal weight loss: Secondary | ICD-10-CM | POA: Diagnosis not present

## 2023-07-23 DIAGNOSIS — I10 Essential (primary) hypertension: Secondary | ICD-10-CM | POA: Diagnosis not present

## 2023-07-23 DIAGNOSIS — Z125 Encounter for screening for malignant neoplasm of prostate: Secondary | ICD-10-CM | POA: Diagnosis not present

## 2023-07-23 DIAGNOSIS — R5383 Other fatigue: Secondary | ICD-10-CM | POA: Diagnosis not present

## 2023-08-28 ENCOUNTER — Encounter: Payer: Self-pay | Admitting: Adult Health

## 2023-08-28 ENCOUNTER — Ambulatory Visit: Payer: 59 | Admitting: Adult Health

## 2023-08-28 VITALS — BP 104/63 | HR 65 | Ht 63.0 in | Wt 124.0 lb

## 2023-08-28 DIAGNOSIS — R569 Unspecified convulsions: Secondary | ICD-10-CM

## 2023-08-28 DIAGNOSIS — Z5181 Encounter for therapeutic drug level monitoring: Secondary | ICD-10-CM | POA: Diagnosis not present

## 2023-08-28 NOTE — Progress Notes (Signed)
PATIENT: Jonathan [name] DOB: July 09, 1966  REASON FOR VISIT: follow up HISTORY FROM: patient  Chief Complaint  Patient presents with   Follow-up    Pt in 4  Pt here for seizure  f/u Pt states did have seizure Nov 2023 Pt states become dizzy and heard noises. Pt went to ED but d/c 4 hours later       HISTORY OF PRESENT ILLNESS: Today 08/28/23:  Jonathan [name] is a 57 y.o. male with a history of seizures. Returns today for follow-up.  He remains on Lamictal 100 mg twice a day he did go to the ED back in November 2023 for breakthrough seizure event it was reported in the hospital note that he reported that he had missed doses of Lamictal.  He states that since then he has not had any additional seizure events.  He has resumed driving.  He does state that back in August he had a fall.  He states that he was "doing too much in the kitchen."  He has not had any additional falls since then.  He returns today for an evaluation.   08/29/22: Jonathan [name] is a 57 year old male with a history of nocturnal seizures.  He returns today for follow-up.  He is now on Lamictal 100 mg twice a day.  Has not had any additional seizure events.  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  Returns today for an evaluation.  12/12/21 Jonathan [name] is a 57 year old male with a history of nocturnal seizures.  He returns today for follow-up.  The patient has now been off Depakote since Saturday.  He is on Lamictal 100 mg twice a day.  Has not had any additional seizure events.  He is tolerating the medication well.  He did have to wean off Depakote a little slower due to a sinus infection.  He is not operating a motor vehicle.   He does note a tremor.  Primarily in the upper extremities worse in the mornings.  Typically only with action does not really notice it with rest.  Not interfering with his daily activities.  No family history of tremor.  No family history of Parkinson disease.  he returns today for an  evaluation.  10/05/21:Jonathan [name] is a 57 year old male with a history of nocturnal seizures.  He returns today for follow-up.  He reports that he had a seizure event on November 22.  His wife is with him today.  She reports that f that night she noticed that he was grinding his teeth moaning and frothing at the mouth.  She states that he was stiff this lasted what she thinks was several minutes but she did not officially time him.  She states that she did wake him up and he was a little confused at first but that improved.  The patient reports that he did have a beer he thinks it was only 1 beer before bedtime.  His grandson was diagnosed with the flu on Saturday.  The patient reports that he was diagnosed with the flu a day and a half after having his seizure event.  He is feeling much better today.  He is currently on Depakote 1500 mg daily.  There was some discrepancy in his chart that he was taking 2000 mg daily.  However he clarifies that he is only taken 1500 mg daily.  Blood work done earlier this week showed elevated LFTs.  However Depakote level was back in normal range.  03/07/20: Jonathan [name]  is a 57 year old male with a history of nocturnal seizures.  He returns today for follow-up.  He reports he has not had any additional seizure events.  Continues on Depakote 2000 mg daily.  Denies any significant side effects.  No change in mood or behavior.  He is able to complete all ADLs independently.  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  06/07/20: Jonathan [name] is a 57 year old male with a history of nocturnal seizures.  He returns today for evaluation.  He states that on Monday night his wife noticed that he was very agitated while he was sleeping moving about in bed and was making a weird noise.  He states that this has been consistent with previous seizure events.  He states that several days before he did miss a dose of Depakote.  He returns today for an evaluation.   HISTORY Update Mar 07, 2020  SS: Jonathan [name] is a 57 year old male with history of seizure disorder and migraine headache.  He remains on Depakote ER 500 mg, 2 tablets at bedtime. His last seizure was in December 2017, he has only ever had 2 seizures, both nocturnal.  He is self-employed, has a Set designer.  His seizures and migraines are currently under good control. He may occasionally have a flash of light, like an aura, but no headache results.  His overall health is good.  He presents today for evaluation unaccompanied.  REVIEW OF SYSTEMS: Out of a complete 14 system review of symptoms, the patient complains only of the following symptoms, and all other reviewed systems are negative.  See HPi  ALLERGIES: Allergies  Allergen Reactions   Bee Venom Other (See Comments)    Childhood - hospitalization    HOME MEDICATIONS: Outpatient Medications Prior to Visit  Medication Sig Dispense Refill   lamoTRIgine (LAMICTAL) 100 MG tablet Take 1 tablet (100 mg total) by mouth 2 (two) times daily. 180 tablet 3   lisinopril-hydrochlorothiazide (PRINZIDE,ZESTORETIC) 20-25 MG tablet Take 0.5 tablets by mouth at bedtime.  10   oseltamivir (TAMIFLU) 75 MG capsule Take 1 capsule (75 mg total) by mouth every 12 (twelve) hours. 10 capsule 0   No facility-administered medications prior to visit.    PAST MEDICAL HISTORY: Past Medical History:  Diagnosis Date   Bee sting allergy    HTN (hypertension)    Migraines    Seizure (HCC)     PAST SURGICAL HISTORY: Past Surgical History:  Procedure Laterality Date   NO PAST SURGERIES      FAMILY HISTORY: Family History  Problem Relation Age of Onset   Hypertension Mother    Heart attack Father    Cancer Maternal Grandmother        breast   Seizures Neg Hx    Tremor Neg Hx     SOCIAL HISTORY: Social History   Socioeconomic History   Marital status: Married    Spouse name: [name]   Number of children: 1   Years of education: 16   Highest education level: Not on file   Occupational History   Occupation: [employer]    Comment: Self-employed  Tobacco Use   Smoking status: Never   Smokeless tobacco: Never  Vaping Use   Vaping status: Never Used  Substance and Sexual Activity   Alcohol use: Not Currently   Drug use: No   Sexual activity: Not on file  Other Topics Concern   Not on file  Social History Narrative   Right-handed.   Lives at home with her  wife.   2 cups caffeine/day.   One child.   Social Determinants of Health   Financial Resource Strain: Not on file  Food Insecurity: Not on file  Transportation Needs: Not on file  Physical Activity: Not on file  Stress: Not on file  Social Connections: Not on file  Intimate Partner Violence: Not on file      PHYSICAL EXAM  Vitals:   08/28/23 0824  BP: 104/63  Pulse: 65  Weight: 124 lb (56.2 kg)  Height: 5\' 3"  (1.6 m)    Body mass index is 21.97 kg/m.  Generalized: Well developed, in no acute distress   Neurological examination  Mentation: Alert oriented to time, place, history taking. Follows all commands speech and language fluent Cranial nerve II-XII: Pupils were equal round reactive to light. Extraocular movements were full, visual field were full on confrontational test.. Head turning and shoulder shrug  were normal and symmetric. Motor: The motor testing reveals 5 over 5 strength of all 4 extremities. Good symmetric motor tone is noted throughout.  Sensory: Sensory testing is intact to soft touch on all 4 extremities. No evidence of extinction is noted.  Coordination: Cerebellar testing reveals good finger-nose-finger and heel-to-shin bilaterally.  Mild tremor in the upper extremities with finger-nose-finger Gait and station: Gait is normal.     DIAGNOSTIC DATA (LABS, IMAGING, TESTING) - I reviewed patient records, labs, notes, testing and imaging myself where available.  Lab Results  Component Value Date   WBC 5.3 09/11/2022   HGB 13.9 09/11/2022   HCT  40.7 09/11/2022   MCV 95.1 09/11/2022   PLT 180 09/11/2022      Component Value Date/Time   NA 136 09/11/2022 1145   NA 138 08/29/2022 0849   K 4.8 09/11/2022 1145   CL 98 09/11/2022 1145   CO2 24 09/11/2022 1145   GLUCOSE 109 (H) 09/11/2022 1145   BUN 13 09/11/2022 1145   BUN 15 08/29/2022 0849   CREATININE 1.04 09/11/2022 1145   CALCIUM 9.5 09/11/2022 1145   PROT 7.0 09/11/2022 1145   PROT 7.4 08/29/2022 0849   ALBUMIN 4.1 09/11/2022 1145   ALBUMIN 5.0 (H) 08/29/2022 0849   AST 44 (H) 09/11/2022 1145   ALT 21 09/11/2022 1145   ALKPHOS 58 09/11/2022 1145   BILITOT 0.9 09/11/2022 1145   BILITOT 0.6 08/29/2022 0849   GFRNONAA >60 09/11/2022 1145   GFRAA 90 12/11/2020 0855      ASSESSMENT AND PLAN 57 y.o. year old male  has a past medical history of Bee sting allergy, HTN (hypertension), Migraines, and Seizure (HCC). here with:  Seizures  Continue Lamictal 100 mg twice a day Blood work today Advised that he has any seizure events he should let us know Advised that if he has any significant changes in his gait or balance or frequent falls he should let us know Follow-up in 1 year or sooner if needed   Butch Penny, MSN, NP-C 08/28/2023, 8:32 AM [facility] [address], [address] [place], [place] [address] [phone]

## 2023-08-30 LAB — COMPREHENSIVE METABOLIC PANEL
ALT: 13 [IU]/L (ref 0–44)
AST: 20 IU/L (ref 0–40)
Albumin: 4.6 g/dL (ref 3.8–4.9)
Alkaline Phosphatase: 83 [IU]/L (ref 44–121)
BUN/Creatinine Ratio: 24 — ABNORMAL HIGH (ref 9–20)
BUN: 23 mg/dL (ref 6–24)
Bilirubin Total: 0.4 mg/dL (ref 0.0–1.2)
CO2: 24 mmol/L (ref 20–29)
Calcium: 9.8 mg/dL (ref 8.7–10.2)
Chloride: 98 mmol/L (ref 96–106)
Creatinine, Ser: 0.96 mg/dL (ref 0.76–1.27)
Globulin, Total: 2.3 g/dL (ref 1.5–4.5)
Glucose: 87 mg/dL (ref 70–99)
Potassium: 4.3 mmol/L (ref 3.5–5.2)
Sodium: 137 mmol/L (ref 134–144)
Total Protein: 6.9 g/dL (ref 6.0–8.5)
eGFR: 93 mL/min/{1.73_m2} (ref >59)

## 2023-08-30 LAB — CBC WITH DIFFERENTIAL/PLATELET
Basophils Absolute: 0.1 10*3/uL (ref 0.0–0.2)
Basos: 1 %
EOS (ABSOLUTE): 0.2 10*3/uL (ref 0.0–0.4)
Eos: 3 %
Hematocrit: 42.1 % (ref 37.5–51.0)
Hemoglobin: 13.7 g/dL (ref 13.0–17.7)
Immature Grans (Abs): 0 10*3/uL (ref 0.0–0.1)
Immature Granulocytes: 0 %
Lymphocytes Absolute: 1.3 10*3/uL (ref 0.7–3.1)
Lymphs: 23 %
MCH: 32.8 pg (ref 26.6–33.0)
MCHC: 32.5 g/dL (ref 31.5–35.7)
MCV: 101 fL — ABNORMAL HIGH (ref 79–97)
Monocytes Absolute: 0.6 10*3/uL (ref 0.1–0.9)
Monocytes: 11 %
Neutrophils Absolute: 3.5 10*3/uL (ref 1.4–7.0)
Neutrophils: 62 %
Platelets: 170 10*3/uL (ref 150–450)
RBC: 4.18 x10E6/uL (ref 4.14–5.80)
RDW: 11.7 % (ref 11.6–15.4)
WBC: 5.7 10*3/uL (ref 3.4–10.8)

## 2023-08-30 LAB — LAMOTRIGINE LEVEL: Lamotrigine Lvl: 5 ug/mL (ref 2.0–20.0)

## 2023-09-01 ENCOUNTER — Telehealth: Payer: Self-pay | Admitting: *Deleted

## 2023-09-01 NOTE — Telephone Encounter (Signed)
Spoke to pt gave lab work results pt thanked me for calling

## 2023-09-01 NOTE — Telephone Encounter (Signed)
-----   Message from [facility] sent at 09/01/2023 12:29 PM EDT ----- Blood work unremarkable

## 2023-09-21 ENCOUNTER — Other Ambulatory Visit: Payer: Self-pay | Admitting: Adult Health

## 2023-09-22 NOTE — Telephone Encounter (Signed)
Pt called wanting to know when this will be called in for him.

## 2023-09-23 NOTE — Telephone Encounter (Signed)
Pt is needing this filled soon due to him about to run out. Please advise.

## 2023-09-25 DIAGNOSIS — Z09 Encounter for follow-up examination after completed treatment for conditions other than malignant neoplasm: Secondary | ICD-10-CM | POA: Diagnosis not present

## 2023-09-25 DIAGNOSIS — Z860101 Personal history of adenomatous and serrated colon polyps: Secondary | ICD-10-CM | POA: Diagnosis not present

## 2024-05-20 ENCOUNTER — Ambulatory Visit (HOSPITAL_COMMUNITY)
Admission: EM | Admit: 2024-05-20 | Discharge: 2024-05-20 | Disposition: A | Discharge status: Home / Self Care | Attending: Internal Medicine | Admitting: Internal Medicine

## 2024-05-20 ENCOUNTER — Encounter (HOSPITAL_COMMUNITY): Payer: Self-pay

## 2024-05-20 DIAGNOSIS — S61211A Laceration without foreign body of left index finger without damage to nail, initial encounter: Secondary | ICD-10-CM

## 2024-05-20 MED ORDER — LIDOCAINE HCL (PF) 2 % IJ SOLN
INTRAMUSCULAR | Status: AC
  Filled 2024-05-20: qty 5

## 2024-05-20 MED ORDER — DOXYCYCLINE HYCLATE 100 MG PO CAPS: 100.0000 mg | ORAL_CAPSULE | Freq: Two times a day (BID) | ORAL | 0 refills | Status: AC

## 2024-05-20 NOTE — Discharge Instructions (Addendum)
 Laceration repair of the lateral, proximal left index finger.  10 sutures were placed today.  Leave the dressing in place until tomorrow then may remove and wash the area with soap and water.  Do not submerge the hand completely in water until the sutures are removed.  Use the finger splint for the first 2 days to keep the finger in a straight position then may remove.  Keep a clean dry dressing over the laceration and change at least once daily.  Follow-up in 7 to 10 days for suture removal.  Take doxycycline  1 capsule twice daily for 5 days this is an antibiotic to help prevent infection.  Check with your primary care physician to ensure that your tetanus is up-to-date and if it is not return for a tetanus booster.

## 2024-05-20 NOTE — ED Triage Notes (Addendum)
 Patient reports that he was carving a piece of plastic with an Exacto knife and cut his left pointer finger.  Patient has not taken any medication for his symptoms.  Patient states he visits his PCP on a regular basis and states he woould be surprised if his Tetanus was not current.

## 2024-05-20 NOTE — ED Provider Notes (Signed)
 [facility]    CSN: [id number] Arrival date & time: 05/20/24  1435      History   Chief Complaint No chief complaint on file.   HPI Jonathan [name] is a 58 y.o. male.   58 year old male who presents urgent care with complaints of a laceration to the left index finger.  He reports that this happened prior to coming in today.  He was working on a PNO with an X-Acto knife that was a fresh blade that he just changed out yesterday.  This slipped and he cut across his finger.  He denies any numbness or tingling.  He denies any difficulty moving the finger.  He had a pressure dressing in place.  He believes that he is up-to-date on his tetanus as he follows up with his primary care physicians at [facility] on a regular basis.     Past Medical History:  Diagnosis Date   Bee sting allergy    HTN (hypertension)    Migraines    Seizure [facility])     Patient Active Problem List   Diagnosis Date Noted   Encounter for therapeutic drug monitoring 03/18/2017   Seizures (HCC) 12/28/2014   Migraine with aura and without status migrainosus, not intractable 12/28/2014   Right inguinal hernia 06/24/2014    Past Surgical History:  Procedure Laterality Date   NO PAST SURGERIES         Home Medications    Prior to Admission medications   Medication Sig Start Date End Date Taking? Authorizing Provider  doxycycline  (VIBRAMYCIN ) 100 MG capsule Take 1 capsule (100 mg total) by mouth 2 (two) times daily for 5 days. 05/20/24 05/25/24 Yes Sirena Riddle A, PA-C  lamoTRIgine  (LAMICTAL ) 100 MG tablet TAKE 1 TABLET BY MOUTH TWICE A DAY 09/23/23   Millikan, Megan, NP  lisinopril-hydrochlorothiazide (PRINZIDE,ZESTORETIC) 20-25 MG tablet Take 0.5 tablets by mouth at bedtime. 09/14/15   [provider]  oseltamivir  (TAMIFLU ) 75 MG capsule Take 1 capsule (75 mg total) by mouth every 12 (twelve) hours. 09/26/21   Joesph Shaver Scales, PA-C    Family History Family History  Problem  Relation Age of Onset   Hypertension Mother    Heart attack Father    Cancer Maternal Grandmother        breast   Seizures Neg Hx    Tremor Neg Hx     Social History Social History   Tobacco Use   Smoking status: Never   Smokeless tobacco: Never  Vaping Use   Vaping status: Never Used  Substance Use Topics   Alcohol use: Not Currently   Drug use: No     Allergies   Bee venom   Review of Systems Review of Systems  Constitutional:  Negative for chills and fever.  HENT:  Negative for ear pain and sore throat.   Eyes:  Negative for pain and visual disturbance.  Respiratory:  Negative for cough and shortness of breath.   Cardiovascular:  Negative for chest pain and palpitations.  Gastrointestinal:  Negative for abdominal pain and vomiting.  Genitourinary:  Negative for dysuria and hematuria.  Musculoskeletal:  Negative for arthralgias and back pain.  Skin:  Positive for wound (left index). Negative for color change and rash.  Neurological:  Negative for seizures and syncope.  All other systems reviewed and are negative.    Physical Exam Triage Vital Signs ED Triage Vitals [05/20/24 1514]  Encounter Vitals Group     BP 100/62  Girls Systolic BP Percentile      Girls Diastolic BP Percentile      Boys Systolic BP Percentile      Boys Diastolic BP Percentile      Pulse Rate (!) 57     Resp      Temp 97.7 F (36.5 C)     Temp Source Oral     SpO2 98 %     Weight      Height      Head Circumference      Peak Flow      Pain Score 4     Pain Loc      Pain Education      Exclude from Growth Chart    No data found.  Updated Vital Signs BP 100/62 (BP Location: Right Arm)   Pulse (!) 57   Temp 97.7 F (36.5 C) (Oral)   SpO2 98%   Visual Acuity Right Eye Distance:   Left Eye Distance:   Bilateral Distance:    Right Eye Near:   Left Eye Near:    Bilateral Near:     Physical Exam Vitals and nursing note reviewed.  Constitutional:      General: He  is not in acute distress.    Appearance: He is well-developed.  HENT:     Head: Normocephalic and atraumatic.  Eyes:     Conjunctiva/sclera: Conjunctivae normal.  Cardiovascular:     Rate and Rhythm: Normal rate and regular rhythm.  Pulmonary:     Effort: Pulmonary effort is normal. No respiratory distress.  Abdominal:     Palpations: Abdomen is soft.     Tenderness: There is no abdominal tenderness.  Musculoskeletal:        General: No swelling.     Left hand: Laceration present. Normal range of motion. Normal strength. Normal sensation. Normal capillary refill. Normal pulse.     Cervical back: Neck supple.     Comments: Deep laceration along the lateral aspect of the left index finger near the base.  No tendon or blood vessel involvement.  Neurovascularly intact distal to the injury  Skin:    General: Skin is warm and dry.     Capillary Refill: Capillary refill takes less than 2 seconds.  Neurological:     Mental Status: He is alert.  Psychiatric:        Mood and Affect: Mood normal.      UC Treatments / Results  Labs (all labs ordered are listed, but only abnormal results are displayed) Labs Reviewed - No data to display  EKG   Radiology No results found.  Procedures Laceration Repair  Date/Time: 05/20/2024 4:21 PM  Performed by: Teresa Almarie LABOR, PA-C Authorized by: Teresa Almarie LABOR, PA-C   Consent:    Consent obtained:  Verbal   Consent given by:  Patient   Risks discussed:  Infection, need for additional repair, pain, poor cosmetic result and poor wound healing   Alternatives discussed:  No treatment and delayed treatment Universal protocol:    Procedure explained and questions answered to patient or proxy's satisfaction: yes     Relevant documents present and verified: yes     Site/side marked: yes     Immediately prior to procedure, a time out was called: yes     Patient identity confirmed:  Verbally with patient Anesthesia:    Anesthesia method:   Local infiltration   Local anesthetic:  Lidocaine  1% w/o epi Laceration details:    Location:  Finger   Finger location:  L index finger Pre-procedure details:    Preparation:  Patient was prepped and draped in usual sterile fashion Exploration:    Limited defect created (wound extended): no     Hemostasis achieved with:  Direct pressure   Wound exploration: wound explored through full range of motion   Treatment:    Area cleansed with:  Povidone-iodine and chlorhexidine   Amount of cleaning:  Extensive   Irrigation solution:  Sterile saline   Irrigation method:  Syringe   Layers/structures repaired:  Deep dermal/superficial fascia Deep dermal/superficial fascia:    Suture size:  5-0   Suture material:  Vicryl   Suture technique:  Horizontal mattress   Number of sutures:  3 Skin repair:    Repair method:  Sutures   Suture size:  5-0   Suture technique:  Simple interrupted   Number of sutures:  10 Approximation:    Approximation:  Close Repair type:    Repair type:  Intermediate Post-procedure details:    Dressing:  Bulky dressing   Procedure completion:  Tolerated well, no immediate complications  (including critical care time)  Medications Ordered in UC Medications - No data to display  Initial Impression / Assessment and Plan / UC Course  I have reviewed the triage vital signs and the nursing notes.  Pertinent labs & imaging results that were available during my care of the patient were reviewed by me and considered in my medical decision making (see chart for details).     Laceration of left index finger without foreign body without damage to nail, initial encounter   Laceration repair of the lateral, proximal left index finger.  10 sutures were placed today.  Leave the dressing in place until tomorrow then may remove and wash the area with soap and water.  Do not submerge the hand completely in water until the sutures are removed.  Use the finger splint for the  first 2 days to keep the finger in a straight position then may remove.  Keep a clean dry dressing over the laceration and change at least once daily.  Follow-up in 7 to 10 days for suture removal.  Take doxycycline  1 capsule twice daily for 5 days this is an antibiotic to help prevent infection.  Check with your primary care physician to ensure that your tetanus is up-to-date and if it is not return for a tetanus booster.  Final Clinical Impressions(s) / UC Diagnoses   Final diagnoses:  Laceration of left index finger without foreign body without damage to nail, initial encounter     Discharge Instructions      Laceration repair of the lateral, proximal left index finger.  10 sutures were placed today.  Leave the dressing in place until tomorrow then may remove and wash the area with soap and water.  Do not submerge the hand completely in water until the sutures are removed.  Use the finger splint for the first 2 days to keep the finger in a straight position then may remove.  Keep a clean dry dressing over the laceration and change at least once daily.  Follow-up in 7 to 10 days for suture removal.  Take doxycycline  1 capsule twice daily for 5 days this is an antibiotic to help prevent infection.  Check with your primary care physician to ensure that your tetanus is up-to-date and if it is not return for a tetanus booster.    ED Prescriptions     Medication Sig  Dispense Auth. Provider   doxycycline  (VIBRAMYCIN ) 100 MG capsule Take 1 capsule (100 mg total) by mouth 2 (two) times daily for 5 days. 10 capsule Teresa Almarie LABOR, [place]      PDMP not reviewed this encounter.   Teresa Almarie LABOR, PA-C 05/20/24 1657

## 2024-05-28 ENCOUNTER — Ambulatory Visit (HOSPITAL_COMMUNITY)

## 2024-08-31 ENCOUNTER — Encounter: Payer: Self-pay | Admitting: Adult Health

## 2024-08-31 ENCOUNTER — Ambulatory Visit: Payer: 59 | Admitting: Adult Health

## 2024-08-31 VITALS — BP 116/70 | HR 77 | Ht 66.0 in | Wt 125.4 lb

## 2024-08-31 DIAGNOSIS — R569 Unspecified convulsions: Secondary | ICD-10-CM

## 2024-08-31 DIAGNOSIS — Z5181 Encounter for therapeutic drug level monitoring: Secondary | ICD-10-CM | POA: Diagnosis not present

## 2024-08-31 MED ORDER — LAMOTRIGINE 100 MG PO TABS: 100.0000 mg | ORAL_TABLET | Freq: Two times a day (BID) | ORAL | 3 refills | Status: AC

## 2024-08-31 NOTE — Patient Instructions (Signed)
 Continue Lamictal  100 mg twice a day Blood work today If you have any seizure events please let us  know.   SEIZURE PRECAUTIONS Per [place] statutes, patients with seizures are not allowed to drive until they have been seizure-free for six months.    Use caution when using heavy equipment or power tools. Avoid working on ladders or at heights. Take showers instead of baths. Ensure the water temperature is not too high on the home water heater. Do not go swimming alone. Do not lock yourself in a room alone (i.e. bathroom). When caring for infants or small children, sit down when holding, feeding, or changing them to minimize risk of injury to the child in the event you have a seizure. Maintain good sleep hygiene. Avoid alcohol.    If patient has another seizure, call 911 and bring them back to the ED if: A.  The seizure lasts longer than 5 minutes.      B.  The patient doesn't wake shortly after the seizure or has new problems such as difficulty seeing, speaking or moving following the seizure C.  The patient was injured during the seizure D.  The patient has a temperature over 102 F (39C) E.  The patient vomited during the seizure and now is having trouble breathing

## 2024-08-31 NOTE — Progress Notes (Signed)
 PATIENT: Jonathan [name] DOB: [birth year]  REASON FOR VISIT: follow up HISTORY FROM: patient  Chief Complaint  Patient presents with   RM 18    Patient is here alone for seizure follow-up - no issues or concerns      HISTORY OF PRESENT ILLNESS: Today 08/31/24:  Jonathan [name] is a 58 y.o. male with a history of seizures. Returns today for follow-up.  Overall he reports that he has been doing well.  Denies any seizure events.  Remains on Lamictal  100 mg twice a day.  He operates a librarian, academic without difficulty.  Able to complete all ADLs independently.  He reports he has noticed some mild memory trouble such as forgetting things that he puts down.  No overall pressing concerns.  Has not discussed with his PCP.  Returns today for an evaluation.   08/28/23: Jonathan MCFADYEN is a 58 y.o. male with a history of seizures. Returns today for follow-up.  He remains on Lamictal  100 mg twice a day he did go to the ED back in November 2023 for breakthrough seizure event it was reported in the hospital note that he reported that he had missed doses of Lamictal .  He states that since then he has not had any additional seizure events.  He has resumed driving.  He does state that back in August he had a fall.  He states that he was doing too much in the kitchen.  He has not had any additional falls since then.  He returns today for an evaluation.   08/29/22: Jonathan [name] is a 58 year old male with a history of nocturnal seizures.  He returns today for follow-up.  He is now on Lamictal  100 mg twice a day.  Has not had any additional seizure events.  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  Returns today for an evaluation.  12/12/21 Jonathan [name] is a 58 year old male with a history of nocturnal seizures.  He returns today for follow-up.  The patient has now been off Depakote  since Saturday.  He is on Lamictal  100 mg twice a day.  Has not had any additional seizure events.  He is tolerating the  medication well.  He did have to wean off Depakote  a little slower due to a sinus infection.  He is not operating a motor vehicle.   He does note a tremor.  Primarily in the upper extremities worse in the mornings.  Typically only with action does not really notice it with rest.  Not interfering with his daily activities.  No family history of tremor.  No family history of Parkinson disease.  he returns today for an evaluation.  10/05/21:Jonathan [name] is a 58 year old male with a history of nocturnal seizures.  He returns today for follow-up.  He reports that he had a seizure event on November 22.  His wife is with him today.  She reports that f that night she noticed that he was grinding his teeth moaning and frothing at the mouth.  She states that he was stiff this lasted what she thinks was several minutes but she did not officially time him.  She states that she did wake him up and he was a little confused at first but that improved.  The patient reports that he did have a beer he thinks it was only 1 beer before bedtime.  His grandson was diagnosed with the flu on Saturday.  The patient reports that he was diagnosed with the flu a day  and a half after having his seizure event.  He is feeling much better today.  He is currently on Depakote  1500 mg daily.  There was some discrepancy in his chart that he was taking 2000 mg daily.  However he clarifies that he is only taken 1500 mg daily.  Blood work done earlier this week showed elevated LFTs.  However Depakote  level was back in normal range.  03/07/20: Jonathan [name] is a 57 year old male with a history of nocturnal seizures.  He returns today for follow-up.  He reports he has not had any additional seizure events.  Continues on Depakote  2000 mg daily.  Denies any significant side effects.  No change in mood or behavior.  He is able to complete all ADLs independently.  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  06/07/20: Jonathan [name] is a 58 year old male with  a history of nocturnal seizures.  He returns today for evaluation.  He states that on Monday night his wife noticed that he was very agitated while he was sleeping moving about in bed and was making a weird noise.  He states that this has been consistent with previous seizure events.  He states that several days before he did miss a dose of Depakote .  He returns today for an evaluation.   HISTORY Update Mar 07, 2020 SS: Jonathan [name] is a 58 year old male with history of seizure disorder and migraine headache.  He remains on Depakote  ER 500 mg, 2 tablets at bedtime. His last seizure was in December 2017, he has only ever had 2 seizures, both nocturnal.  He is self-employed, has a set designer.  His seizures and migraines are currently under good control. He may occasionally have a flash of light, like an aura, but no headache results.  His overall health is good.  He presents today for evaluation unaccompanied.  REVIEW OF SYSTEMS: Out of a complete 14 system review of symptoms, the patient complains only of the following symptoms, and all other reviewed systems are negative.  See HPi  ALLERGIES: Allergies  Allergen Reactions   Bee Venom Other (See Comments)    Childhood - hospitalization    HOME MEDICATIONS: Outpatient Medications Prior to Visit  Medication Sig Dispense Refill   lisinopril-hydrochlorothiazide (PRINZIDE,ZESTORETIC) 20-25 MG tablet Take 0.5 tablets by mouth at bedtime.  10   lamoTRIgine  (LAMICTAL ) 100 MG tablet TAKE 1 TABLET BY MOUTH TWICE A DAY 180 tablet 3   oseltamivir  (TAMIFLU ) 75 MG capsule Take 1 capsule (75 mg total) by mouth every 12 (twelve) hours. (Patient not taking: Reported on 08/31/2024) 10 capsule 0   No facility-administered medications prior to visit.    PAST MEDICAL HISTORY: Past Medical History:  Diagnosis Date   Bee sting allergy    HTN (hypertension)    Migraines    Seizure (HCC)     PAST SURGICAL HISTORY: Past Surgical History:  Procedure  Laterality Date   NO PAST SURGERIES      FAMILY HISTORY: Family History  Problem Relation Age of Onset   Hypertension Mother    Heart attack Father    Cancer Maternal Grandmother        breast   Seizures Neg Hx    Tremor Neg Hx     SOCIAL HISTORY: Social History   Socioeconomic History   Marital status: Married    Spouse name: Lauraine   Number of children: 1   Years of education: 16   Highest education level: Not on file  Occupational History  Occupation: [employer]    Comment: Self-employed  Tobacco Use   Smoking status: Never   Smokeless tobacco: Never  Vaping Use   Vaping status: Never Used  Substance and Sexual Activity   Alcohol use: Not Currently   Drug use: No   Sexual activity: Not on file  Other Topics Concern   Not on file  Social History Narrative   Right-handed.   Lives at home with her wife.   2 cups caffeine/day.   One child.   Social Drivers of Corporate Investment Banker Strain: Not on file  Food Insecurity: Not on file  Transportation Needs: Not on file  Physical Activity: Not on file  Stress: Not on file  Social Connections: Not on file  Intimate Partner Violence: Not on file      PHYSICAL EXAM  Vitals:   08/31/24 1035  BP: 116/70  Pulse: 77  SpO2: 99%  Weight: 125 lb 6.4 oz (56.9 kg)  Height: 5' 6 (1.676 m)     Body mass index is 20.24 kg/m.  Generalized: Well developed, in no acute distress   Neurological examination  Mentation: Alert oriented to time, place, history taking. Follows all commands speech and language fluent Cranial nerve II-XII: Pupils were equal round reactive to light. Extraocular movements were full, visual field were full on confrontational test.. Head turning and shoulder shrug  were normal and symmetric. Motor: The motor testing reveals 5 over 5 strength of all 4 extremities. Good symmetric motor tone is noted throughout.  Sensory: Sensory testing is intact to soft touch on all 4  extremities. No evidence of extinction is noted.  Coordination: Cerebellar testing reveals good finger-nose-finger and heel-to-shin bilaterally.  Mild tremor in the upper extremities with finger-nose-finger Gait and station: Gait is normal.     DIAGNOSTIC DATA (LABS, IMAGING, TESTING) - I reviewed patient records, labs, notes, testing and imaging myself where available.  Lab Results  Component Value Date   WBC 5.7 08/28/2023   HGB 13.7 08/28/2023   HCT 42.1 08/28/2023   MCV 101 (H) 08/28/2023   PLT 170 08/28/2023      Component Value Date/Time   NA 137 08/28/2023 0853   K 4.3 08/28/2023 0853   CL 98 08/28/2023 0853   CO2 24 08/28/2023 0853   GLUCOSE 87 08/28/2023 0853   GLUCOSE 109 (H) 09/11/2022 1145   BUN 23 08/28/2023 0853   CREATININE 0.96 08/28/2023 0853   CALCIUM 9.8 08/28/2023 0853   PROT 6.9 08/28/2023 0853   ALBUMIN 4.6 08/28/2023 0853   AST 20 08/28/2023 0853   ALT 13 08/28/2023 0853   ALKPHOS 83 08/28/2023 0853   BILITOT 0.4 08/28/2023 0853   GFRNONAA >60 09/11/2022 1145   GFRAA 90 12/11/2020 0855      ASSESSMENT AND PLAN 58 y.o. year old male  has a past medical history of Bee sting allergy, HTN (hypertension), Migraines, and Seizure (HCC). here with:  Seizures  Continue Lamictal  100 mg twice a day Blood work today Advised that he has any seizure events he should let us  know Advised that if he should discuss memory concerns with his PCP.  If they feel that a neurological workup is needed we can certainly get the patient back in the office for new problem visit. Follow-up in 1 year or sooner if needed   Duwaine Russell, MSN, NP-C 08/31/2024, 10:56 AM [facility] [address], [address] [place], [place] [address] [phone]  The patient's condition requires frequent monitoring  and adjustments in the treatment plan, reflecting the ongoing complexity of care.  This provider is the continuing focal point for all needed services for  this condition.

## 2024-09-01 LAB — COMPREHENSIVE METABOLIC PANEL WITH GFR
ALT: 14 IU/L (ref 0–44)
AST: 26 IU/L (ref 0–40)
Albumin: 4.7 g/dL (ref 3.8–4.9)
Alkaline Phosphatase: 76 IU/L (ref 47–123)
BUN/Creatinine Ratio: 19 (ref 9–20)
BUN: 21 mg/dL (ref 6–24)
Bilirubin Total: 0.4 mg/dL (ref 0.0–1.2)
CO2: 25 mmol/L (ref 20–29)
Calcium: 9.9 mg/dL (ref 8.7–10.2)
Chloride: 98 mmol/L (ref 96–106)
Creatinine, Ser: 1.11 mg/dL (ref 0.76–1.27)
Globulin, Total: 2.7 g/dL (ref 1.5–4.5)
Glucose: 88 mg/dL (ref 70–99)
Potassium: 4.5 mmol/L (ref 3.5–5.2)
Sodium: 136 mmol/L (ref 134–144)
Total Protein: 7.4 g/dL (ref 6.0–8.5)
eGFR: 77 mL/min/1.73 (ref >59)

## 2024-09-01 LAB — CBC WITH DIFFERENTIAL/PLATELET
Basophils Absolute: 0.1 x10E3/uL (ref 0.0–0.2)
Basos: 2 %
EOS (ABSOLUTE): 0.1 x10E3/uL (ref 0.0–0.4)
Eos: 2 %
Hematocrit: 41.8 % (ref 37.5–51.0)
Hemoglobin: 13.9 g/dL (ref 13.0–17.7)
Immature Grans (Abs): 0 x10E3/uL (ref 0.0–0.1)
Immature Granulocytes: 0 %
Lymphocytes Absolute: 1.4 x10E3/uL (ref 0.7–3.1)
Lymphs: 31 %
MCH: 32.3 pg (ref 26.6–33.0)
MCHC: 33.3 g/dL (ref 31.5–35.7)
MCV: 97 fL (ref 79–97)
Monocytes Absolute: 0.5 x10E3/uL (ref 0.1–0.9)
Monocytes: 11 %
Neutrophils Absolute: 2.5 x10E3/uL (ref 1.4–7.0)
Neutrophils: 54 %
Platelets: 191 x10E3/uL (ref 150–450)
RBC: 4.31 x10E6/uL (ref 4.14–5.80)
RDW: 11.6 % (ref 11.6–15.4)
WBC: 4.5 x10E3/uL (ref 3.4–10.8)

## 2024-09-01 LAB — LAMOTRIGINE LEVEL: Lamotrigine Lvl: 4.8 ug/mL (ref 2.0–20.0)

## 2024-09-02 ENCOUNTER — Ambulatory Visit: Payer: Self-pay | Admitting: Adult Health

## 2024-09-02 NOTE — Telephone Encounter (Signed)
 Spoke with pt and let him know labs were unremarkable. He thanked me for the call.

## 2024-09-02 NOTE — Telephone Encounter (Signed)
-----   Message from Duwaine Russell sent at 09/02/2024  9:34 AM EDT ----- Blood work unremarkable ----- Message ----- From: Rebecka Memos Lab Results In Sent: 09/01/2024   7:37 AM EDT To: Duwaine Russell, NP

## 2024-12-07 ENCOUNTER — Ambulatory Visit: Payer: Self-pay | Admitting: Family Medicine

## 2024-12-08 ENCOUNTER — Encounter: Payer: Self-pay | Admitting: Internal Medicine

## 2024-12-08 ENCOUNTER — Ambulatory Visit: Payer: Self-pay | Admitting: Internal Medicine

## 2024-12-08 VITALS — BP 110/78 | HR 59 | Temp 97.5°F | Ht 66.5 in | Wt 124.8 lb

## 2024-12-08 DIAGNOSIS — R569 Unspecified convulsions: Secondary | ICD-10-CM | POA: Diagnosis not present

## 2024-12-08 DIAGNOSIS — I10 Essential (primary) hypertension: Secondary | ICD-10-CM | POA: Diagnosis not present

## 2024-12-08 MED ORDER — LISINOPRIL-HYDROCHLOROTHIAZIDE 20-25 MG PO TABS: 0.5000 | ORAL_TABLET | Freq: Every day | ORAL | 1 refills | Status: AC

## 2024-12-08 NOTE — Assessment & Plan Note (Signed)
 History of seizures noted.  On Lamictal , followed by [facility] neurology.

## 2024-12-08 NOTE — Progress Notes (Signed)
 "   New Patient Office Visit     CC/Reason for Visit: Establish care, discuss chronic medical conditions, medication refills Previous PCP: Niels Roys, MD Last Visit: 2025  HPI: Jonathan [name] is a 59 y.o. male who is coming in today for the above mentioned reasons. Past Medical History is significant for: Hypertension that has been well-controlled on lisinopril /hydrochlorothiazide  as well as seizure disorder followed by neurology on Lamictal .  Last seizure was approximately 18 months ago.  He has been feeling well.  Has no acute concerns or complaints.  Is needing refills of blood pressure medication.  All immunizations are up-to-date.  Colon cancer screening is up-to-date.   Past Medical/Surgical History: Past Medical History:  Diagnosis Date   Bee sting allergy    HTN (hypertension)    Migraines    Seizure (HCC)     Past Surgical History:  Procedure Laterality Date   NO PAST SURGERIES      Social History:  reports that he has never smoked. He has never used smokeless tobacco. He reports current alcohol use of about 3.0 standard drinks of alcohol per week. He reports that he does not use drugs.  Allergies: Allergies[1]  Family History:  Family History  Problem Relation Age of Onset   Hypertension Mother    Heart attack Father    Cancer Maternal Grandmother        breast   Seizures Neg Hx    Tremor Neg Hx     Current Medications[2]  Review of Systems:  Negative except as indicated in HPI.   Physical Exam: Vitals:   12/08/24 0736  BP: 110/78  Pulse: (!) 59  Temp: (!) 97.5 F (36.4 C)  TempSrc: Oral  SpO2: 97%  Weight: 124 lb 12.8 oz (56.6 kg)  Height: 5' 6.5 (1.689 m)   Body mass index is 19.84 kg/m.  Physical Exam Vitals reviewed.  Constitutional:      Appearance: Normal appearance.  HENT:     Head: Normocephalic and atraumatic.  Eyes:     Conjunctiva/sclera: Conjunctivae normal.  Cardiovascular:     Rate and Rhythm: Normal rate and regular  rhythm.  Pulmonary:     Effort: Pulmonary effort is normal.     Breath sounds: Normal breath sounds.  Skin:    General: Skin is warm and dry.  Neurological:     General: No focal deficit present.     Mental Status: He is alert and oriented to person, place, and time.  Psychiatric:        Mood and Affect: Mood normal.        Behavior: Behavior normal.        Thought Content: Thought content normal.        Judgment: Judgment normal.       Impression and Plan:  Primary hypertension Assessment & Plan: Well-controlled on current.  Refilled medication.  Orders: -     Lisinopril -hydroCHLOROthiazide ; Take 0.5 tablets by mouth at bedtime.  Dispense: 90 tablet; Refill: 1  Seizure (HCC) Assessment & Plan: History of seizures noted.  On Lamictal , followed by [facility] neurology.     Time spent: 46 minutes reviewing chart, interviewing and examining patient and formulating plan of care.       Tully Theophilus Andrews, MD [facility]     [1]  Allergies Allergen Reactions   Bee Venom Other (See Comments)    Childhood - hospitalization  [2]  Current Outpatient Medications:    lamoTRIgine  (LAMICTAL ) 100 MG  tablet, Take 1 tablet (100 mg total) by mouth 2 (two) times daily., Disp: 180 tablet, Rfl: 3   lisinopril -hydrochlorothiazide  (ZESTORETIC ) 20-25 MG tablet, Take 0.5 tablets by mouth at bedtime., Disp: 90 tablet, Rfl: 1  "

## 2024-12-08 NOTE — Assessment & Plan Note (Signed)
 Well-controlled on current.  Refilled medication.

## 2025-09-01 ENCOUNTER — Ambulatory Visit: Admitting: Adult Health
